# Patient Record
Sex: Female | Born: 1960 | Race: White | Hispanic: No | Smoking: Current every day smoker
Health system: Southern US, Community
[De-identification: ages and names within clinical notes are randomized; demographics above are authoritative.]

## PROBLEM LIST (undated history)

## (undated) DIAGNOSIS — I10 Essential (primary) hypertension: Secondary | ICD-10-CM

## (undated) DIAGNOSIS — G5763 Lesion of plantar nerve, bilateral lower limbs: Secondary | ICD-10-CM

---

## 2000-09-14 ENCOUNTER — Emergency Department (HOSPITAL_COMMUNITY): Admission: EM | Admit: 2000-09-14 | Discharge: 2000-09-14 | Payer: Self-pay | Admitting: Emergency Medicine

## 2000-09-17 ENCOUNTER — Emergency Department (HOSPITAL_COMMUNITY): Admission: EM | Admit: 2000-09-17 | Discharge: 2000-09-17 | Payer: Self-pay | Admitting: Internal Medicine

## 2003-05-25 ENCOUNTER — Emergency Department (HOSPITAL_COMMUNITY): Admission: EM | Admit: 2003-05-25 | Discharge: 2003-05-25 | Payer: Self-pay | Admitting: Emergency Medicine

## 2003-10-07 ENCOUNTER — Emergency Department (HOSPITAL_COMMUNITY): Admission: EM | Admit: 2003-10-07 | Discharge: 2003-10-07 | Payer: Self-pay | Admitting: Emergency Medicine

## 2003-10-20 ENCOUNTER — Emergency Department (HOSPITAL_COMMUNITY): Admission: EM | Admit: 2003-10-20 | Discharge: 2003-10-20 | Payer: Self-pay | Admitting: Emergency Medicine

## 2005-07-03 ENCOUNTER — Emergency Department (HOSPITAL_COMMUNITY): Admission: EM | Admit: 2005-07-03 | Discharge: 2005-07-03 | Payer: Self-pay | Admitting: Emergency Medicine

## 2006-06-08 ENCOUNTER — Emergency Department (HOSPITAL_COMMUNITY): Admission: EM | Admit: 2006-06-08 | Discharge: 2006-06-08 | Payer: Self-pay | Admitting: Family Medicine

## 2006-09-07 ENCOUNTER — Observation Stay (HOSPITAL_COMMUNITY): Admission: EM | Admit: 2006-09-07 | Discharge: 2006-09-08 | Payer: Self-pay | Admitting: Emergency Medicine

## 2006-09-13 ENCOUNTER — Inpatient Hospital Stay (HOSPITAL_COMMUNITY): Admission: EM | Admit: 2006-09-13 | Discharge: 2006-09-14 | Payer: Self-pay | Admitting: *Deleted

## 2007-06-11 ENCOUNTER — Ambulatory Visit: Payer: Self-pay | Admitting: Gastroenterology

## 2007-06-28 ENCOUNTER — Ambulatory Visit (HOSPITAL_COMMUNITY): Admission: RE | Admit: 2007-06-28 | Discharge: 2007-06-28 | Payer: Self-pay | Admitting: Gastroenterology

## 2007-06-28 ENCOUNTER — Encounter: Payer: Self-pay | Admitting: Gastroenterology

## 2007-06-28 ENCOUNTER — Ambulatory Visit: Payer: Self-pay | Admitting: Gastroenterology

## 2007-07-08 ENCOUNTER — Ambulatory Visit: Payer: Self-pay | Admitting: Gastroenterology

## 2007-08-03 ENCOUNTER — Ambulatory Visit: Payer: Self-pay | Admitting: Gastroenterology

## 2007-08-09 ENCOUNTER — Encounter (HOSPITAL_COMMUNITY): Admission: RE | Admit: 2007-08-09 | Discharge: 2007-09-08 | Payer: Self-pay | Admitting: Gastroenterology

## 2008-08-01 ENCOUNTER — Ambulatory Visit (HOSPITAL_COMMUNITY): Admission: RE | Admit: 2008-08-01 | Discharge: 2008-08-01 | Payer: Self-pay | Admitting: Podiatry

## 2010-02-11 ENCOUNTER — Emergency Department (HOSPITAL_COMMUNITY): Admission: EM | Admit: 2010-02-11 | Discharge: 2010-02-11 | Payer: Self-pay | Admitting: Emergency Medicine

## 2010-02-20 ENCOUNTER — Ambulatory Visit (HOSPITAL_COMMUNITY): Admission: RE | Admit: 2010-02-20 | Discharge: 2010-02-21 | Payer: Self-pay | Admitting: Neurological Surgery

## 2010-03-18 ENCOUNTER — Encounter: Admission: RE | Admit: 2010-03-18 | Discharge: 2010-03-18 | Payer: Self-pay | Admitting: Neurological Surgery

## 2010-08-01 LAB — CBC
HCT: 43.1 % (ref 36.0–46.0)
Hemoglobin: 15.3 g/dL — ABNORMAL HIGH (ref 12.0–15.0)
MCH: 32.8 pg (ref 26.0–34.0)
MCHC: 35.5 g/dL (ref 30.0–36.0)
MCV: 92.5 fL (ref 78.0–100.0)
Platelets: 228 10*3/uL (ref 150–400)
RBC: 4.66 MIL/uL (ref 3.87–5.11)
RDW: 13.3 % (ref 11.5–15.5)
WBC: 8.2 10*3/uL (ref 4.0–10.5)

## 2010-08-01 LAB — BASIC METABOLIC PANEL
BUN: 10 mg/dL (ref 6–23)
CO2: 28 mEq/L (ref 19–32)
Calcium: 8.8 mg/dL (ref 8.4–10.5)
Chloride: 104 mEq/L (ref 96–112)
Creatinine, Ser: 0.72 mg/dL (ref 0.4–1.2)
GFR calc Af Amer: >60 mL/min (ref >60)
GFR calc non Af Amer: >60 mL/min (ref >60)
Glucose, Bld: 77 mg/dL (ref 70–99)
Potassium: 4.2 mEq/L (ref 3.5–5.1)
Sodium: 136 mEq/L (ref 135–145)

## 2010-08-01 LAB — DIFFERENTIAL
Basophils Absolute: 0 10*3/uL (ref 0.0–0.1)
Basophils Relative: 0 % (ref 0–1)
Eosinophils Absolute: 0.2 10*3/uL (ref 0.0–0.7)
Eosinophils Relative: 2 % (ref 0–5)
Lymphocytes Relative: 42 % (ref 12–46)
Lymphs Abs: 3.4 10*3/uL (ref 0.7–4.0)
Monocytes Absolute: 0.7 10*3/uL (ref 0.1–1.0)
Monocytes Relative: 8 % (ref 3–12)
Neutro Abs: 3.8 10*3/uL (ref 1.7–7.7)
Neutrophils Relative %: 47 % (ref 43–77)

## 2010-08-01 LAB — APTT: aPTT: 28 seconds (ref 24–37)

## 2010-08-01 LAB — SURGICAL PCR SCREEN
MRSA, PCR: NEGATIVE
Staphylococcus aureus: NEGATIVE

## 2010-08-01 LAB — HCG, SERUM, QUALITATIVE: Preg, Serum: NEGATIVE

## 2010-08-01 LAB — PROTIME-INR
INR: 0.97 (ref 0.00–1.49)
Prothrombin Time: 13.1 seconds (ref 11.6–15.2)

## 2010-10-01 NOTE — Group Therapy Note (Signed)
NAMETELESHA, DEGUZMAN               ACCOUNT NO.:  [id number]   MEDICAL RECORD NO.:  [id number]          PATIENT TYPE:  AMB   LOCATION:  DAY                           FACILITY:  [facility]   PHYSICIAN:  Cody M. Ulice Brilliant, D.P.M.  DATE OF BIRTH:  1960/08/30                                 PROGRESS NOTE   PREOPERATIVE DIAGNOSIS:  Chronic plantar fascitis/plantar fasciosis, left heel.   POSTOPERATIVE DIAGNOSIS:  Chronic plantar fascitis/plantar fasciosis, left heel.   PROCEDURE PERFORMED:  Percutaneous microfasciotomy via Topaz wand, left heel.   SURGEON:  Denny Peon. Ulice Brilliant, D.P.M.   ANESTHESIA:  MAC.   ANESTHESIOLOGIST:  Johnney Killian, M.D.   INDICATIONS FOR SURGERY:  A greater than 1 year history of pain in left heel with classic plantar  fascitis heel spur symptoms.  Patient clinically is noted to have  discomfort to palpation over the medial central and lateral band of the  fascia extending into the proximal aspect of the arch, continuous  throbbing pain, worse upon arising after sitting or upon getting up in  the morning.  Ms. Colvin has began missing work because of this  condition.   DESCRIPTION OF PROCEDURE:  Prior to the procedure in the preoperative holding area, Ms. Viglione is  evaluated.  The area of maximal tenderness is confined and demarcated  utilizing a skin marking pen.  Ms. Jupiter has been brought into the OR.  IV sedation is established.  A posterior tibial nerve block is performed  about her left ankle.  Further local anesthesia is administered about  the medial and lateral aspects of the heel.  A pneumatic ankle  tourniquet is then applied across her left ankle.  Her foot is prepped  and draped in the usual aseptic fashion.  An Ace bandage is utilized to  exsanguinate her foot.  The tourniquet is inflated to 250 mmHg.   PROCEDURE:  Percutaneous microfasciotomy, left heel, via Topaz wand.  Attention is  directed to the plantar aspect of the left heel.  The previously  demarcated area of maximal discomfort is appreciated.  A grid pattern is  then demarcated utilizing the skin marking pen within this area.  Once  the area is mapped out, utilizing 5 mm intervals between the areas of  penetration, utilizing a 0.062 K wire, the initial skin incisions are  made in this 5-mm grid pattern.  These number 55.  Following penetration  of all of these sites with the #62 K wire, the straight wand is inserted  through the fascia, and the ablation mode is triggered, utilizing a 0.5  second burst of radiofrequency energy.  This is done through each of the  55 areas that had been marked and penetrated with the 62 K wire, with  care taken to go through the fat pad into the fascia and then releasing  the energy source.  Care is taken to try to vary the depths of the  penetration from one area to the next via reduced pressure on the wand.  Following this 55 burst of energy, the heel is cleaned, 1/2 inch Steri-  Strips are  applied across the area, 3x3's, 4x4's, and 2-inch Desma Paganini are  utilized to dress, followed by a Coban.  Tourniquet is then deflated  with tourniquet spending less than 20 minutes.   Ms. Danish tolerates the anesthesia and procedure well and will be  transported to day hospital.  While there, a list of written  instructions are orally explained to her.  A CAM walker will be applied  and dispensed.  She will be seen in 7 days in the [place] office for her  first postop appointment.      Denny Peon. Ulice Brilliant, D.P.M.  Electronically Signed     CMD/MEDQ  D:  08/01/2008  T:  08/01/2008  Job:  [id number]

## 2010-10-01 NOTE — H&P (Signed)
NAMEMALAIJAH, HOUCHEN               ACCOUNT NO.:  [id number]   MEDICAL RECORD NO.:  [id number]          PATIENT TYPE:  AMB   LOCATION:  DAY                           FACILITY:  [facility]   PHYSICIAN:  Cody M. Ulice Brilliant, D.P.M.  DATE OF BIRTH:  Mar 07, 1961   DATE OF ADMISSION:  DATE OF DISCHARGE:  LH                              HISTORY & PHYSICAL   HISTORY OF PRESENT ILLNESS:  Ms. Vinje has plantar heel pain present for  greater than 1 year.  The patient has been treated conservatively with  injection therapy, rest, nonsteroidal anti-inflammatories which she  could not tolerate, use of a [employer] shoe, time out of work, and  home physical therapy, all of which has resulted in incomplete relief of  plantar left heel pain.  The patient was seen in October and was  injected and she did improve but came back in January and condition has  just not since then in spite of injections and steroids by mouth.   PAST MEDICAL HISTORY:  Significant for ulcerative colitis.   MEDICATIONS:  Her on going list of medications include Aciphex, Ambien,  Paxil.   SOCIAL HISTORY:  She is a half-pack per day smoker, occasional alcohol  drinker.  She has been under incredible family stress secondary to  caring for both a mother-in-law and son who are in failing health.   OBJECTIVE:  Ms. Rogoff has significant discomfort to palpation of the  left heel in the plantar medial region of the fascia just distal to the  calcaneal tubercle.  The area is noted to be somewhat swollen and  painful.   Radiographs do reveal a calcaneal spur.   ASSESSMENT:  Plantar fasciitis/plantar fasciosis, not improving, left  heel.   PLAN:  We have discussed the options which include shock wave therapy,  Coblation therapy, and endoscopic plantar fascial release.  I think in  Ms. Kriegel's case, a Coblation procedure makes the most sense.  She works  Engineer, structural and I would like to get this to resolve without changing the  biomechanics of  her foot.  We have described Coblation to her and how  this works and the fact that it does give complete relief immediately.  She seems to understand.  She has read the consent form, apparently  understood and signed.      Denny Peon. Ulice Brilliant, D.P.M.  Electronically Signed     CMD/MEDQ  D:  07/31/2008  T:  07/31/2008  Job:  [id number]

## 2010-10-01 NOTE — Op Note (Signed)
NAMECHARA, MARQUARD               ACCOUNT NO.:  [id number]   MEDICAL RECORD NO.:  [id number]          PATIENT TYPE:  AMB   LOCATION:  DAY                           FACILITY:  [facility]   PHYSICIAN:  [name], M.D.      DATE OF BIRTH:  1961-02-27   DATE OF PROCEDURE:  06/28/2007  DATE OF DISCHARGE:                               OPERATIVE REPORT   PROCEDURE:  Esophagogastroduodenoscopy with cold forceps biopsy and  Bravo capsule placement.   INDICATION FOR EXAM:  Ms. Shad is a 50 year old female who has had  reflux since 2007.  She states her symptoms were not improved with  Prilosec twice daily.  She had an upper GI in April 2008 which showed  free gastroesophageal reflux.  She occasionally uses Aleve.  She  periodically vomits.  She states she vomits four to five times a week.   FINDINGS:  1. Occasional erythema and erosion in the antrum.  Biopsies obtained      via cold forceps to evaluate for H. pylori gastritis.  2. Normal esophagus without evidence of Barrett's, mass, erosion or      ulceration or stricture.  The GE junction located 35 cm from the      teeth.  The Bravo capsule was placed 29 cm from the teeth.  3. Normal duodenal bulb and second portion of the duodenum.   RECOMMENDATIONS:  1. Continue Bravo study for 96 hours.  She should take her Prilosec      twice daily.  2. She should continue to follow the [facility] handout      recommendations for reflux.  3. No aspirin or NSAIDs for 30 days.  No anticoagulation for five      days.  4. She may resume her previous diet but avoid gastric irritants.  5. Will consider a gastric emptying study if her Bravo study shows no      evidence of uncontrolled gastroesophageal reflux disease on      adequate therapy.   MEDICATIONS:  1. Demerol 50 mg IV.  2. Versed 5 mg IV.   PROCEDURE TECHNIQUE:  Physical exam was performed.  Informed consent was  obtained from the patient after explaining the benefits, risks and  alternatives to the procedure.  The patient was connected to monitor and  placed in left lateral position.  Continuous oxygen was provided by  nasal cannula and IV medicine administered through an indwelling  cannula.  After administration of sedation, the patient's esophagus was  intubated.  The scope was advanced under direct visualization to the  second portion of the duodenum.  The scope was removed slowly by  carefully examining the color, texture, anatomy and integrity of the  mucosa on the way out.  The Bravo capsule introducer was placed in the  esophagus 29 cm from the teeth.  Suction was applied for 1 minute.  The  diagnostic  gastroscope was then advanced into the distal esophagus and successful  placement of the Bravo capsule was confirmed.  The introducer and the  scope were withdrawn.  The patient was recovered in endoscopy  and  discharged home in satisfactory condition.      [name], M.D.  Electronically Signed     SM/MEDQ  D:  06/28/2007  T:  06/29/2007  Job:  [id number]   cc:   Kirstie Peri, MD  Fax: [phone]

## 2010-10-01 NOTE — Assessment & Plan Note (Signed)
[name], [name]                CHART#:  [id number]   DATE:  08/03/2007                       DOB:  [birth year]   REFERRING PHYSICIAN:  Kirstie Peri, MD   DATE OF VISIT:  August 03, 2007.   PROBLEM LIST:  1. Gastroesophageal reflux disease on Prilosec twice daily with a      Bravo study showing adequate acid suppression in February 2009.  2. Mild gastritis without evidence of Helicobacter pylori infection on      esophagogastroduodenoscopy in February 2009.  3. Anxiety.  4. Depression.  5. Cholecystectomy at age 73.  6. Abdominal pain.   SUBJECTIVE:  [name] is a 50 year old female who presents as a return  patient visit.  She was last seen June 28, 2007, for her endoscopy.  I spoke to her on the 18th and let her know that her Prilosec is  working.  She states that Prilosec continues to control her symptoms.  She is keeping her grandkids off of her stomach.  She is having less  problems with her abdominal pain.  She complains of nausea for a month.  She feels like she is starving to death and food makes her more  nauseated.  She has a hard time brushing her teeth because it makes her  gag.  She denies any headaches or vision changes.  She does have  significant posterior nasal drip, which she treats as allergies.  She  thinks the allergy medicine may have made things worse.  She complains  of a scratchy throat.  Off and on she has ear infections, right  greater than left.  She said she has been vomiting every day for a  month.  She has 4 or 5 watery stools daily.  If she eats restaurant  food, that is a whole lot worse.  She has a little milk daily.   MEDICATIONS:  1. Paxil 20 mg daily.  2. Ambien 10 mg q.h.s.  3. Prilosec twice a day.  4. Multivitamin.  5. Calcium with vitamin D.   OBJECTIVE:  VITAL SIGNS:  Weight 196 pounds (down 4 pounds since January  2009).  Height 5 feet 5 inches.  BMI 32.6 (obese).  Temperature 97.6.  Blood pressure 110/80.  Pulse 72.   GENERAL:  She is in no apparent  distress, alert and oriented x4.  HEENT:  Atraumatic, normocephalic.  Her posterior pharynx has mild erythema without exudate or mass.  LUNGS:  Clear to auscultation bilaterally. CARDIOVASCULAR:  Show regular rhythm.  No murmur.  ABDOMEN:  Bowel sounds are present, soft, nondistended.  Mild tenderness to palpation in her flanks without rebound or guarding.  NEURO:  She has no focal neurologic deficits.   ASSESSMENT:  [name] is a 50 year old female who has nausea and  vomiting daily.  The differential diagnosis includes gastroparesis and  nausea and vomiting secondary to chronic posterior nasal drip.  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  She has  watery stools daily which may be irritable bowel or bile salt induced  diarrhea. Thank you for allowing me to see [name] in consultation.  My recommendations follow.   RECOMMENDATIONS:  1. Will add cholestyramine 4 gram packet daily.  She is warned that if      the cholestyramine causes the diarrhea  to get worse, she should      stop it.  2. She may use Levsin 1 or 2 thirty minutes before breakfast, lunch,      or dinner to prevent diarrhea.  She is cautioned that it can cause      drowsiness, dry eyes, dry mouth and urinary retention.  3. We will order a gastric emptying study to evaluate her daily nausea      and vomiting.  4. She has a follow up appointment to see me in 6 weeks.   ADDENDUM:  GES: abnl-43% remains after 2 hrs. Needs Reglan and  Gastroparesis diet.       [name], M.D.  Electronically Signed     SM/MEDQ  D:  08/03/2007  T:  08/04/2007  Job:  [id number]   cc:   Kirstie Peri, MD

## 2010-10-01 NOTE — Op Note (Signed)
[name], [name]               ACCOUNT NO.:  [id number]   MEDICAL RECORD NO.:  [id number]          PATIENT TYPE:  AMB   LOCATION:  DAY                           FACILITY:  [facility]   PHYSICIAN:  [name], M.D.      DATE OF BIRTH:  April 02, 1961   DATE OF PROCEDURE:  06/28/2007  DATE OF DISCHARGE:  06/28/2007                               OPERATIVE REPORT   PROCEDURE:  Bravo pH monitoring study.   REFERRING PHYSICIAN:  Kirstie Peri, M.D.   INDICATION FOR EXAM:  Ms. Defrank is a 50 year old female who presented in  January 2009 complaining of uncontrolled reflux.  She said she was  eating Rolaids and it was not touching her pain and she was vomiting 4-5  times a week.  This was in spite of Prilosec twice daily.  The Bravo  capsule was placed to evaluate for uncontrolled reflux and the need for  Nissen fundoplication.  The study is being performed on therapy.   FINDINGS:  Day one showed study of 21 hours and 41 minutes.  She had six  episodes reflux.  No episodes lasted greater than five minutes.  She  spent one minute with a pH of less than 4 in her esophagus.  The  DeMeester score on day one was 1.   On day two, the duration of the study was 19 hours and 59 minutes.  She  had 25 episodes of reflux.  One episode lasted greater than five  minutes.  She had 21 minutes with a pH of less than 4 in her esophagus.  Her DeMeester score on day two was 6.3.  She had a symptomatic  correlation of chest pain at 87% and heartburn of 43.7%.   DIAGNOSIS:  Ms. Ellenberger is a 50 year old female on Prilosec twice daily  which is providing adequate acid suppression.  She may still have a non-  acid gastroesophageal reflux disease.   RECOMMENDATIONS:  She should continue Prilosec twice daily and to adhere  to the [facility] gastroesophageal reflux disease lifestyle  modification.      [name], M.D.  Electronically Signed     SM/MEDQ  D:  07/08/2007  T:  07/09/2007  Job:  [id number]   cc:    Kirstie Peri, MD  Fax: [phone]

## 2010-10-01 NOTE — Consult Note (Signed)
NAMESANDAR, [name]               ACCOUNT NO.:  [id number]   MEDICAL RECORD NO.:  [id number]          PATIENT TYPE:  AMB   LOCATION:  DAY                           FACILITY:  [facility]   PHYSICIAN:  [name], M.D.      DATE OF BIRTH:  18-Feb-1961   DATE OF CONSULTATION:  06/11/2007  DATE OF DISCHARGE:                                 CONSULTATION   REFERRING PHYSICIAN:  Kirstie Peri, MD   REASON CONSULTATION:  Reflux and abdominal pain.   HISTORY OF PRESENT ILLNESS:  [name] is a 50 year old female who has  had problem with reflux since 2007.  She was eating Rolaids, and it  wasn't touching it.  She vomits 4-5 times a week.  She denies any  weight loss.  She has had diarrhea for years.  She has diarrhea at least  once a day.  She does have normal formed stool as well.  She denies any  nausea.  When she vomits, she feels it come on, she heaves, it comes up,  and then she feels better.  She complains of feeling very tender in her  abdomen and her grandchildren cannot sit on her lap, because she cannot  stand the pressure.  She was told she had a small hiatal hernia.  She  have an upper GI in April 2008 which showed normal esophageal  peristalsis, and a normal stomach.  She had a small hiatal hernia which  demonstrated free gastroesophageal reflux.  She had evidence of prior  peptic ulcer disease.  She denies any rectal bleeding or black tarry  stools.  She has had no trouble swallowing.  She denies any aspirin, BC,  or Goody Powder use.  She rarely uses Aleve.   PAST MEDICAL HISTORY:  1. Stress/anxiety.  2. Depression.  3. Reflux.   PAST SURGICAL HISTORY:  1. Cholecystectomy at age 50 for gallstones.  2. Appendectomy.  3. C-section  4. Tonsillectomy.   ALLERGIES:  SULFA and CODEINE.   MEDICATIONS:  1. Paxil 20 mg daily.  2. Ambien 10 mg nightly.  3. Prilosec b.i.d.  4. Multivitamin.  5. Calcium with vitamin D.   FAMILY HISTORY:  She has an aunt who had colon cancer at  age less than  65.  Her mother and her grandmother had breast cancer.  She had a sister  who had ovarian cancer.  She no family history of uterine cancer.   SOCIAL HISTORY:  Her stress stems from care of her handicapped son.  She  also has a son who has an arrhythmia.  She is separated.  She takes care  of her mother.  She works as a Merchandiser, retail at [employer].  She smokes less  than one pack a day.  She drinks no alcohol.   REVIEW OF SYSTEMS:  Per the HPI, otherwise all systems are negative.   PHYSICAL EXAM:  VITAL SIGNS:  Weight 200 pounds, height 5 feet 5 inches,  BMI 33.3, temperature 97.7, blood pressure 110/78, pulse 18.  GENERAL:  She is in no apparent distress, alert and oriented x4.  HEENT:  Exam is atraumatic, normocephalic.  Pupils equal, and react to  light.  Mouth no oral lesions.  Posterior pharynx without erythema or  exudate.  NECK:  Full range of motion.  No lymphadenopathy.  LUNGS:  Clear to auscultation bilaterally.  CARDIOVASCULAR:  Regular rhythm, no murmur, normal S1-S2.  ABDOMEN:  Bowel sounds are present, soft, nontender, nondistended.  No  rebound or guarding.  No hepatosplenomegaly.  EXTREMITIES:  Have no cyanosis or edema.  NEURO:  She has no focal or neurologic deficits.   ASSESSMENT:  [name] is a 50 year old female who complains of  refractory reflux.  Her symptoms persist in spite of Prilosec twice a  day.  The differential diagnosis includes partially treated  gastroesophageal reflux disease, non-ulcer dyspepsia, or gastritis.  Thank you for allowing me to see [name] in consultation.  My  recommendations follow.   RECOMMENDATIONS:  1. We will schedule her for an EGD with a 96-hour Bravo study in 2      weeks.  2. She is given the [facility] hand out and asked to follow the      recommendations in regards to the management of her reflux.  3. She is to continue the omeprazole and to take it 30 minutes before      her first-and-last meal.  4. She  has a follow up appointment to see me in 2 months.      [name], M.D.  Electronically Signed     SM/MEDQ  D:  06/11/2007  T:  06/11/2007  Job:  [id number]   cc:   Kirstie Peri, MD  Fax: [phone]

## 2010-10-04 NOTE — Discharge Summary (Signed)
NAMEALIS, [name]               ACCOUNT NO.:  [id number]   MEDICAL RECORD NO.:  [id number]          PATIENT TYPE:  OBV   LOCATION:  5733                         FACILITY:  [facility]   PHYSICIAN:  Mobolaji B. Bakare, M.D.DATE OF BIRTH:  [birth year]   DATE OF ADMISSION:  09/06/2006  DATE OF DISCHARGE:  09/08/2006                               DISCHARGE SUMMARY   PRIMARY CARE PHYSICIAN:  Unassigned.   FINAL DIAGNOSES:  1. Nausea and vomiting, likely viral etiology.  2. Gastroesophageal reflux disease.  3. Leukocytosis, reactive.  4. Tobacco abuse.  5. Small hiatal hernia.   SECONDARY DIAGNOSIS:  History of allergic rhino sinusitis.   PROCEDURE:  Upper GI series, showed small hiatal hernia with free  gastroesophageal reflux.  No fixed esophageal strictures or masses.  Scar tissue with __________ contraction.  Scarring in the duodenal bulb  consistent with prior peptic ulcer disease.  No active ulceration.  Linear atelectasis of __________  restricted in the lung base on the  scout image.   BRIEF HISTORY:  Please refer to the admission H&P.   HOSPITAL COURSE:  1. Nausea and vomiting.  Ms. Harmsen presented with nausea and vomiting,      which happened acutely on the day of admission, associated with      dizziness, but there was no fever or diarrhea.  Patient could not      keep anything down.  Patient was admitted and treated      symptomatically.  Symptoms actually abated with IV Phenergan and IV      fluid.  She was mild to moderately dehydrated.  These had resolved.      Her BUN and creatinine were normal.  Urinalysis was unremarkable      for any sign of infection.  She had leukocytosis, which has      resolved because, felt to be reactive in nature.  2. Gastroesophageal reflux disease.  Patient has history of past      complaint of heartburn , which has been ongoing for 2 months, but      this became more exacerbated during this illness  with epigastric      pain and she had  an upper GI series because she had mentioned food      sticking in her throat and GI series report is as noted above.  She      notes reflux disease.  She will continue with PPI.  3. Tobacco abuse.  Patient was counseled on tobacco cessation.  She      will continue with nicotine patch.   DISCHARGE MEDICATIONS:  1. Prilosec OTC 20 mg 2 tablets daily.  2. Benadryl to use at bedtime like before.  3. Plavix 75 mg daily.  4. Nicotine patch 40 mg daily.   INSTRUCTIONS:  To stop Advair/Aleve.   FOLLOWUP:  Patient to follow up with a physician of her choice.  She was  given Dr. Shon Baton contact phone number to set up appointment for  routine followup.      Mobolaji B. Corky Downs, M.D.  Electronically Signed     MBB/MEDQ  D:  09/08/2006  T:  09/08/2006  Job:  [id number]

## 2010-10-04 NOTE — Op Note (Signed)
[facility]  Patient:    [name], [name]                      MRN: [id number] Proc. Date: 09/17/00 Adm. Date:  [id number] Disc. Date: [id number] Attending:  Gustavo Lah A                           Operative Report  CLINICAL HISTORY:  This patient is a 50 year old woman who had a cyst from the labial area removed by Dr. Zachery Dakins earlier today. She apparently understood her instructions as to take the packing out this afternoon which she did and she immediately began bright red bleeding and soaked through several feminine hygiene pads. I asked her to come in the emergency room to evaluate the area.  She had a fair amount of clot on the incision and on the pubic area. The area was unable to be examined secondary to discomfort so we prepped her with some Betadine and anesthetized the area around the prior cyst removal with 2% xylocaine eventually totaling about 15 cc.  DESCRIPTION OF PROCEDURE:  Once adequate anesthesia had been obtained, the clot was evacuated and irrigated and I found what appeared to be a vessel at the bottom of the cyst cavity that was actively arterially bleeding. This was sutured with two sutures of 3-0 chromic. It appeared to track down along the labia at the deep end of the cavity and I put another suture at the lower edge to be sure there was no further bleeding later on. I packed it with a gauze and waited about five minutes and it remained dry. It was then packed again with some Surgical and again waited several minutes to be sure it stayed dry and then applied sterile packings. She is to leave this Surgicel packing in until Saturday when she may shower. If it comes out fine otherwise leave it in until she is back in the office on Monday. She is already on antibiotics. Give her some Vicodin to take home with her for pain for tonight and tomorrow. DD:  09/17/00 TD:  09/18/00 Job: [id number]

## 2010-10-04 NOTE — H&P (Signed)
[name], [name]               ACCOUNT NO.:  [id number]   MEDICAL RECORD NO.:  [id number]          PATIENT TYPE:  OBV   LOCATION:  5733                         FACILITY:  [facility]   PHYSICIAN:  Mobolaji B. Bakare, M.D.DATE OF BIRTH:  30-Aug-1960   DATE OF ADMISSION:  09/06/2006  DATE OF DISCHARGE:                              HISTORY & PHYSICAL   CHIEF COMPLAINT:  Nausea and vomiting.   HISTORY OF PRESENTING COMPLAINT:  [name] is a 50 year old Caucasian  female who has past medical history allergic rhinosinusitis. She has  been staying with her mother at [facility]  for the past three  days.  She has not gone back home.  Her mother has lung cancer with bone  metastases.  She woke up this morning about 8:30 a.m and started feeling  dizzy.  She subsequently developed epigastric pain associated with  nausea and vomiting.  There was no fever, no diarrhea.  She did not eat  anything all day.  Epigastric pain improved and she rated the pain an  8/10 prior to receiving analgesia in the emergency room.  The pain has  not eased off.  During the course of the day, she has vomited several  times.  She stated up to 20 times but no diarrhea.  She came down to the  emergency room for evaluation.  Currently vomiting has eased off as  well.  After she received Phenergan, Zofran, Protonix, Antivert and  Dilaudid.   REVIEW OF SYSTEMS:  The patient states that she has been having  heartburn for about two months, but she has not used any medications for  it.  Of note is that she has been using Advil  p.r.n.  She denies  hematemesis.  No cough, chest pain, shortness of breath, no orthopnea or  PND.   PAST MEDICAL HISTORY:  1. Allergic rhinosinusitis.  2. Syncope in June 2005 which was thought to be vasovagal.  3. Menorrhagia. She follows up with her OB/GYN.   PAST SURGICAL HISTORY:  1. Hysterectomy.  2. Appendectomy.  3. Cesarean section.  4. Tonsillectomy.   CURRENT  MEDICATIONS:  1. Benadryl at bedtime p.r.n.  2. Ferritin one tablet daily.  3. Tylenol p.r.n.  4. Advil p.r.n.   ALLERGIES:  CODEINE, SULFA.   FAMILY HISTORY:  Mother recently diagnosed with lung cancer with bony  metastases.  Father had pancreatic cancer and lung cancer.   SOCIAL HISTORY:  She is  __________ .  She is married. Has three sons.  Youngest is 22.  She smokes half a pack a day of cigarettes.  Does not  drink alcohol.   PHYSICAL EXAMINATION:  VITAL SIGNS:  Temperature 97.9, blood pressure  128/98, pulse 102, respiratory rate 20, oxygen saturation 98% on room  air.  GENERAL:  On examination the patient is uncomfortable.  Not in  respiratory distress.  HEENT: Normocephalic.  She is currently not in any pain, but currently  uncomfortable.  No pale, anicteric.  Mucous membranes dry.  No oral  thrush.  NECK:  No elevated JVD.  No carotid bruits.  LUNGS:  Clear clinically to auscultation.  CARDIOVASCULAR:  S1 and S2 regular.  ABDOMEN:  Not distended.  Soft.  No tenderness.  Bowel sounds present.  No palpable organomegaly.  EXTREMITIES:  Bilateral pitting pedal edema 1+.  Of note is she had  varicose distended extremity veins.  Dorsalis pedis pulses 2+  bilaterally.  CNS:  No focal neurological deficit.   INITIAL LABORATORY DATA:  White cell count 14.6, hemoglobin 15,  hematocrit 43, platelets 245,000, neutrophils 85%, lymphocytes 8%.  Sodium 134, potassium 3.5, chloride 105, bicarb 25, glucose 99, BUN 20,  creatinine 0.7.  AST 18, ALT 18.  Total protein 6.5, albumin 3.4,  calcium 8.3, lipase 27.  Urinalysis specific gravity 1.029, nitrites  negative, small leukocytes.  Microscopy is unremarkable.  White cells 0-  2.   ASSESSMENT/PLAN:  [name] is a 50 year old Caucasian female who has  been staying with her mom in the hospital for the past three nights. She  developed nausea and vomiting and epigastric pain this morning.  She is  dehydrated.  Lipase is normal.   She has elevated white cell count  without fever.  She does have history of heartburn.   ADMISSION DIAGNOSES:  1. Nausea and vomiting, epigastric pain.  Probably viral gastritis.      She will be admitted for 24-hour observation and treatment with      Phenergan 12.5 mg IV q.4h p.r.n., normal saline at 125 mL  per      hour, Protonix 40 mg IV q.24h.  Will check orthostatic blood      pressure.  2. Leukocytosis secondary to #1.  She has no fever, probably viral      etiology.  Will check CBC in the a.m.  3. History of heartburn.  Will start proton pump inhibitor and      probably start the patient on Prilosec OTC.  Will advise to      discontinue Advil.  4. Tobacco abuse.  Will offer smoking cessation counseling and      nicotine patch 14 mg daily.      Mobolaji B. Corky Downs, M.D.  Electronically Signed     MBB/MEDQ  D:  09/06/2006  T:  09/07/2006  Job:  [id number]

## 2010-10-04 NOTE — Discharge Summary (Signed)
[name], [name]               ACCOUNT NO.:  [id number]   MEDICAL RECORD NO.:  [id number]          PATIENT TYPE:  INP   LOCATION:  4707                         FACILITY:  [facility]   PHYSICIAN:  Wilson Singer, M.D.DATE OF BIRTH:  Feb 11, 1961   DATE OF ADMISSION:  09/13/2006  DATE OF DISCHARGE:  09/14/2006                               DISCHARGE SUMMARY   FINAL DISCHARGE DIAGNOSES:  1. Musculoskeletal chest pain.  2. Anxiety/stress.  3. Gastroesophageal reflux disease.   MEDICATIONS ON DISCHARGE:  1. Prilosec 20 mg b.i.d. as she took at home.  2. Tramadol 50 mg 1-2 tablets t.i.d. p.r.n. for pain.  3. Ativan 0.5 mg b.i.d. p.r.n. for anxiety.   CONDITION ON DISCHARGE:  Stable.   HISTORY:  This 50 year old lady was admitted to the hospital with chest  pain for the last 3-4 days prior to admission.  She does have a history  of reflux disease.  Please see initial history and physical examination  done by myself.   HOSPITAL PROGRESS:  She was admitted to the telemetry unit and she  underwent serial cardiac enzyme testing.  These were all negative.  Also  serial electrocardiograms were normal with normal sinus rhythm and no  acute ST/T wave changes.  It is interestingly to note that she was  clearly tender in the right anterior chest, which reproduced her pain.  She also has had a lot of anxiety and stress with her mother who has got  metastatic lung cancer and also one of her sons who is causing problems  at home.   FURTHER DISPOSITION:  I am sending her home on her tramadol for  analgesia.  She has been told not to take any nonsteroidal  antiinflammatory agents due to her severe reflux disease and also Ativan  only to be taken b.i.d. as needed.  I have urged her strongly to find a  primary care physician who will follow her and who may refer her to a  gastroenterologist for an endoscopy if he feels this is needed.  I do  not feel this is needed at this time as she has no acute  problems  regarding this.      Wilson Singer, M.D.  Electronically Signed     NCG/MEDQ  D:  09/14/2006  T:  09/14/2006  Job:  [id number]

## 2010-10-04 NOTE — H&P (Signed)
NAME:  [name], JOBST               ACCOUNT NO.:  [id number]   MEDICAL RECORD NO.:  [id number]          PATIENT TYPE:  EMS   LOCATION:  MAJO                         FACILITY:  [facility]   PHYSICIAN:  Wilson Singer, M.D.DATE OF BIRTH:  [birth year]   DATE OF ADMISSION:  09/13/2006  DATE OF DISCHARGE:                              HISTORY & PHYSICAL   HISTORY:  This is a 50 year old lady who was recently discharged from  the hospital about 5 days after she presented with appeared to be a  viral gastroenteritis and was found to have gastroesophageal reflux  disease on upper GI series.  Please see History and Physical Examination  dated April 20th and also the Discharge Summary dated April 22nd.  She  says that now she has had chest pain for the last 3 to 4 days and in  fact they started on the day that she had an upper GI series which  according to the old chart is on September 07, 2006.  The pain is not  related to respiration.  There is no cough.  She is a smoker as  mentioned previously.   PAST MEDICAL HISTORY:  Significant for gastroesophageal reflux disease  and allergic rhinosinusitis.   PAST SURGICAL HISTORY:  1. Hysterectomy.  2. Appendectomy.  3. C-Section.  4. Tonsillectomy.  5. Cholecystectomy.   SOCIAL HISTORY:  Her mother has lung cancer with metastatic disease and  is at home, and I think is becoming very stressful for her.  Also, her  son is causing a great deal of stress in her life, and she would not  elaborate at this point.  She is a smoker.  Her job is also high-level  stress.   REVIEW OF SYSTEMS:  Apart from the symptoms mentioned above, there are  no other symptoms referable to all systems review.   PHYSICAL EXAMINATION:  Temperature 98.1, blood pressure 106/74, pulse  62, saturation 97% on room air.  Cardiovascular examination:  Heart  sounds are present and normal.  Lung fields are clear.  There is no  pleural rub.  Chest wall:  There is clear tenderness in  the right  anterior chest wall which reproduces her pain. Abdomen: Soft and  nontender with no hepatosplenomegaly.  Neurological:  She is alert and  oriented with no focal neurological signs.   INVESTIGATIONS:  Lipase 18, troponin less than 0.05.   EKG done in the emergency room today shows normal sinus rhythm and is  within normal limits.   IMPRESSION:  1. Possible musculoskeletal chest pain.  2. Gastroesophageal reflux disease.  3. Stress/anxiety.   PLAN:  1. Admit to telemetry.  2. Rule out myocardial infarction.  3. Anxiolytics.  4. Analgesics.   The patient was unkeen to go home although she possibly could have  because this I think is musculoskeletal pain, but I think we will make  sure there are no cardiac problems here, and hopefully she will improve  with the 23-hour observation.  Further recommendations will depend on  her hospital progress.      Wilson Singer, M.D.  Electronically Signed  NCG/MEDQ  D:  09/13/2006  T:  09/13/2006  Job:  [id number]

## 2014-04-24 ENCOUNTER — Other Ambulatory Visit: Payer: Self-pay | Admitting: Neurological Surgery

## 2014-04-24 DIAGNOSIS — M542 Cervicalgia: Secondary | ICD-10-CM

## 2014-05-01 ENCOUNTER — Ambulatory Visit (INDEPENDENT_AMBULATORY_CARE_PROVIDER_SITE_OTHER): Payer: BC Managed Care – PPO

## 2014-05-01 DIAGNOSIS — M47892 Other spondylosis, cervical region: Secondary | ICD-10-CM

## 2014-05-01 DIAGNOSIS — M4802 Spinal stenosis, cervical region: Secondary | ICD-10-CM

## 2014-05-01 DIAGNOSIS — M542 Cervicalgia: Secondary | ICD-10-CM

## 2018-01-10 ENCOUNTER — Emergency Department (HOSPITAL_COMMUNITY): Payer: BLUE CROSS/BLUE SHIELD

## 2018-01-10 ENCOUNTER — Encounter (HOSPITAL_COMMUNITY): Payer: Self-pay

## 2018-01-10 ENCOUNTER — Emergency Department (HOSPITAL_COMMUNITY)
Admission: EM | Admit: 2018-01-10 | Discharge: 2018-01-10 | Disposition: A | Discharge status: Home / Self Care | Payer: BLUE CROSS/BLUE SHIELD | Attending: Emergency Medicine | Admitting: Emergency Medicine

## 2018-01-10 DIAGNOSIS — Y990 Civilian activity done for income or pay: Secondary | ICD-10-CM | POA: Diagnosis not present

## 2018-01-10 DIAGNOSIS — S3992XA Unspecified injury of lower back, initial encounter: Secondary | ICD-10-CM | POA: Diagnosis present

## 2018-01-10 DIAGNOSIS — Y929 Unspecified place or not applicable: Secondary | ICD-10-CM | POA: Insufficient documentation

## 2018-01-10 DIAGNOSIS — S39012A Strain of muscle, fascia and tendon of lower back, initial encounter: Secondary | ICD-10-CM | POA: Diagnosis not present

## 2018-01-10 DIAGNOSIS — I10 Essential (primary) hypertension: Secondary | ICD-10-CM | POA: Diagnosis not present

## 2018-01-10 DIAGNOSIS — X500XXA Overexertion from strenuous movement or load, initial encounter: Secondary | ICD-10-CM | POA: Insufficient documentation

## 2018-01-10 DIAGNOSIS — Y939 Activity, unspecified: Secondary | ICD-10-CM | POA: Diagnosis not present

## 2018-01-10 HISTORY — DX: Essential (primary) hypertension: I10

## 2018-01-10 LAB — URINALYSIS, ROUTINE W REFLEX MICROSCOPIC
Bilirubin Urine: NEGATIVE
Glucose, UA: NEGATIVE mg/dL
Ketones, ur: NEGATIVE mg/dL
Leukocytes, UA: NEGATIVE
Nitrite: NEGATIVE
Protein, ur: NEGATIVE mg/dL
Specific Gravity, Urine: 1.023 (ref 1.005–1.030)
pH: 5 (ref 5.0–8.0)

## 2018-01-10 MED ORDER — METHOCARBAMOL 750 MG PO TABS: 750.0000 mg | ORAL_TABLET | Freq: Four times a day (QID) | ORAL | 0 refills | Status: DC

## 2018-01-10 MED ORDER — IBUPROFEN 600 MG PO TABS: 600.0000 mg | ORAL_TABLET | Freq: Four times a day (QID) | ORAL | 0 refills | Status: DC | PRN

## 2018-01-10 NOTE — ED Triage Notes (Addendum)
Patient complains of lower back pain after bending over at work today. Pain with ROM. Took ibuprofen prior to arrival also complains of burning to left ovary, denies dysuria

## 2018-01-10 NOTE — ED Notes (Signed)
Pt taken via wheelchair to lobby following d/c. Pt given d/c instructions and expressed no questions at time of discharge.

## 2018-01-10 NOTE — ED Provider Notes (Signed)
[facility] EMERGENCY DEPARTMENT Provider Note   CSN: [id number] Arrival date & time: 01/10/18  1629     History   Chief Complaint No chief complaint on file.   HPI MYLENA SEDBERRY is a 57 y.o. female.  57 year old female presents with acute onset of lower back pain after she bent over at work today.  And characterizes sharp and worse with any movement.  No bowel or bladder dysfunction.  Took ibuprofen without relief.  No urinary symptoms.  Pain better with being still     Past Medical History:  Diagnosis Date  . Hypertension     There are no active problems to display for this patient.   History reviewed. No pertinent surgical history.   OB History   None      Home Medications    Prior to Admission medications   Not on File    Family History No family history on file.  Social History Social History   Tobacco Use  . Smoking status: Not on file  Substance Use Topics  . Alcohol use: Not on file  . Drug use: Not on file     Allergies   Patient has no known allergies.   Review of Systems Review of Systems  All other systems reviewed and are negative.    Physical Exam Updated Vital Signs BP 109/74   Pulse 63   Temp 98.1 F (36.7 C) (Oral)   Resp 20   SpO2 99%   Physical Exam  Constitutional: She is oriented to person, place, and time. She appears well-developed and well-nourished.  Non-toxic appearance. No distress.  HENT:  Head: Normocephalic and atraumatic.  Eyes: Pupils are equal, round, and reactive to light. Conjunctivae, EOM and lids are normal.  Neck: Normal range of motion. Neck supple. No tracheal deviation present. No thyroid mass present.  Cardiovascular: Normal rate, regular rhythm and normal heart sounds. Exam reveals no gallop.  No murmur heard. Pulmonary/Chest: Effort normal and breath sounds normal. No stridor. No respiratory distress. She has no decreased breath sounds. She has no wheezes. She has no  rhonchi. She has no rales.  Abdominal: Soft. Normal appearance and bowel sounds are normal. She exhibits no distension. There is no tenderness. There is no rebound and no CVA tenderness.  Musculoskeletal: Normal range of motion. She exhibits no edema or tenderness.       Back:  Neurological: She is alert and oriented to person, place, and time. She has normal strength. No cranial nerve deficit or sensory deficit. GCS eye subscore is 4. GCS verbal subscore is 5. GCS motor subscore is 6.  Skin: Skin is warm and dry. No abrasion and no rash noted.  Psychiatric: She has a normal mood and affect. Her speech is normal and behavior is normal.  Nursing note and vitals reviewed.    ED Treatments / Results  Labs (all labs ordered are listed, but only abnormal results are displayed) Labs Reviewed  URINALYSIS, ROUTINE W REFLEX MICROSCOPIC - Abnormal; Notable for the following components:      Result Value   Hgb urine dipstick SMALL (*)    Bacteria, UA RARE (*)    All other components within normal limits    EKG None  Radiology No results found.  Procedures Procedures (including critical care time)  Medications Ordered in ED Medications - No data to display   Initial Impression / Assessment and Plan / ED Course  I have reviewed the triage vital signs and the  nursing notes.  Pertinent labs & imaging results that were available during my care of the patient were reviewed by me and considered in my medical decision making (see chart for details).     X-rays of lumbar spine negative.  She has no neurological deficits or warning signs for cauda equina.  Return precautions given  Final Clinical Impressions(s) / ED Diagnoses   Final diagnoses:  None    ED Discharge Orders    None       Lorre NickAllen, Tedrick Port, MD 01/10/18 [id number]

## 2018-11-18 ENCOUNTER — Encounter (HOSPITAL_COMMUNITY): Payer: Self-pay | Admitting: Emergency Medicine

## 2018-11-18 ENCOUNTER — Emergency Department (HOSPITAL_COMMUNITY): Payer: BC Managed Care – PPO

## 2018-11-18 ENCOUNTER — Emergency Department (HOSPITAL_COMMUNITY)
Admission: EM | Admit: 2018-11-18 | Discharge: 2018-11-18 | Disposition: A | Discharge status: Home / Self Care | Payer: BC Managed Care – PPO | Attending: Emergency Medicine | Admitting: Emergency Medicine

## 2018-11-18 ENCOUNTER — Other Ambulatory Visit: Payer: Self-pay

## 2018-11-18 DIAGNOSIS — X500XXA Overexertion from strenuous movement or load, initial encounter: Secondary | ICD-10-CM | POA: Diagnosis not present

## 2018-11-18 DIAGNOSIS — S39012A Strain of muscle, fascia and tendon of lower back, initial encounter: Secondary | ICD-10-CM | POA: Diagnosis not present

## 2018-11-18 DIAGNOSIS — Y929 Unspecified place or not applicable: Secondary | ICD-10-CM | POA: Insufficient documentation

## 2018-11-18 DIAGNOSIS — Z882 Allergy status to sulfonamides status: Secondary | ICD-10-CM | POA: Insufficient documentation

## 2018-11-18 DIAGNOSIS — Y9389 Activity, other specified: Secondary | ICD-10-CM | POA: Diagnosis not present

## 2018-11-18 DIAGNOSIS — Y999 Unspecified external cause status: Secondary | ICD-10-CM | POA: Diagnosis not present

## 2018-11-18 DIAGNOSIS — S3992XA Unspecified injury of lower back, initial encounter: Secondary | ICD-10-CM | POA: Diagnosis present

## 2018-11-18 DIAGNOSIS — F17210 Nicotine dependence, cigarettes, uncomplicated: Secondary | ICD-10-CM | POA: Diagnosis not present

## 2018-11-18 DIAGNOSIS — I10 Essential (primary) hypertension: Secondary | ICD-10-CM | POA: Insufficient documentation

## 2018-11-18 MED ORDER — METHYLPREDNISOLONE SODIUM SUCC 125 MG IJ SOLR
125.0000 mg | Freq: Once | INTRAMUSCULAR | Status: AC
  Administered 2018-11-18: 16:00:00 125 mg via INTRAMUSCULAR
  Filled 2018-11-18: qty 2

## 2018-11-18 MED ORDER — HYDROCODONE-ACETAMINOPHEN 5-325 MG PO TABS: 1.0000 | ORAL_TABLET | ORAL | 0 refills | Status: DC | PRN

## 2018-11-18 MED ORDER — PREDNISONE 10 MG PO TABS: ORAL_TABLET | ORAL | 0 refills | Status: AC

## 2018-11-18 MED ORDER — METHOCARBAMOL 500 MG PO TABS: 500.0000 mg | ORAL_TABLET | Freq: Two times a day (BID) | ORAL | 0 refills | Status: AC

## 2018-11-18 MED ORDER — KETOROLAC TROMETHAMINE 60 MG/2ML IM SOLN
60.0000 mg | Freq: Once | INTRAMUSCULAR | Status: AC
  Administered 2018-11-18: 60 mg via INTRAMUSCULAR
  Filled 2018-11-18: qty 2

## 2018-11-18 NOTE — ED Provider Notes (Signed)
[facility] EMERGENCY DEPARTMENT Provider Note   CSN: [id number] Arrival date & time: 11/18/18  1344     History   Chief Complaint Chief Complaint  Patient presents with  . Back Pain    HPI [name] is a 58 y.o. female.     The history is provided by the patient. No language interpreter was used.  Back Pain Location:  Lumbar spine Quality:  Aching Radiates to:  Does not radiate Pain severity:  Moderate Pain is:  Same all the time Onset quality:  Gradual Timing:  Constant Progression:  Worsening Chronicity:  New Context: lifting heavy objects   Relieved by:  Nothing Worsened by:  Nothing Ineffective treatments:  None tried Pt reports she lifts heavy boxes at work.  Pt reports pain mid back.   Past Medical History:  Diagnosis Date  . Hypertension     There are no active problems to display for this patient.   Past Surgical History:  Procedure Laterality Date  . APPENDECTOMY  1972  . CESAREAN SECTION    . CHOLECYSTECTOMY  1980  . TENDON RELEASE     both feet  . TONSILLECTOMY  1975   age 21     OB History   No obstetric history on file.      Home Medications    Prior to Admission medications   Medication Sig Start Date End Date Taking? Authorizing Provider  HYDROcodone-acetaminophen (NORCO/VICODIN) 5-325 MG tablet Take 1 tablet by mouth every 4 (four) hours as needed. 11/18/18   Fransico Meadow, PA-C  methocarbamol (ROBAXIN) 500 MG tablet Take 1 tablet (500 mg total) by mouth 2 (two) times daily. 11/18/18   Fransico Meadow, PA-C  predniSONE (DELTASONE) 10 MG tablet [id number] taper 11/18/18   Fransico Meadow, PA-C    Family History No family history on file.  Social History Social History   Tobacco Use  . Smoking status: Current Every Day Smoker    Packs/day: 0.50  . Smokeless tobacco: Never Used  Substance Use Topics  . Alcohol use: Not on file    Comment: occ  . Drug use: Never     Allergies   Sulfa antibiotics   Review of Systems Review of Systems  Musculoskeletal: Positive for back pain.  All other systems reviewed and are negative.    Physical Exam Updated Vital Signs BP 112/86 (BP Location: Right Arm)   Pulse 60   Temp 97.6 F (36.4 C) (Oral)   Resp 15   SpO2 99%   Physical Exam Vitals signs and nursing note reviewed.  Constitutional:      Appearance: She is well-developed.  HENT:     Head: Normocephalic.     Nose: Nose normal.     Mouth/Throat:     Mouth: Mucous membranes are moist.  Neck:     Musculoskeletal: Normal range of motion.  Cardiovascular:     Rate and Rhythm: Normal rate.     Pulses: Normal pulses.  Pulmonary:     Effort: Pulmonary effort is normal.  Abdominal:     General: There is no distension.  Musculoskeletal: Normal range of motion.     Comments: tendeer lower lumbar spine,  Pain with range of motion,  nv and ns intact   Skin:    General: Skin is warm.  Neurological:     General: No focal deficit present.     Mental Status: She is alert and oriented to person, place, and time.  ED Treatments / Results  Labs (all labs ordered are listed, but only abnormal results are displayed) Labs Reviewed - No data to display  EKG None  Radiology Dg Lumbar Spine Complete  Result Date: 11/18/2018 CLINICAL DATA:  Acute lower back and left leg pain without known injury. EXAM: LUMBAR SPINE - COMPLETE 4+ VIEW COMPARISON:  Radiographs of January 10, 2018. FINDINGS: No fracture or spondylolisthesis is noted. Severe degenerative disc disease is noted at T12-L1. Mild degenerative disc disease is noted at L5-S1. Mild degenerative disc disease is also noted at L2-3. Degenerative changes are seen involving the posterior facet joints of L4-5 and L5-S1. No acute abnormality is noted. IMPRESSION: Multilevel degenerative disc disease. No acute abnormality seen in the lumbar spine. Electronically Signed   By: Lupita RaiderJames  Green Jr M.D.   On: 11/18/2018 15:29    Procedures  Procedures (including critical care time)  Medications Ordered in ED Medications  ketorolac (TORADOL) injection 60 mg (60 mg Intramuscular Given 11/18/18 1536)  methylPREDNISolone sodium succinate (SOLU-MEDROL) 125 mg/2 mL injection 125 mg (125 mg Intramuscular Given 11/18/18 1536)     Initial Impression / Assessment and Plan / ED Course  I have reviewed the triage vital signs and the nursing notes.  Pertinent labs & imaging results that were available during my care of the patient were reviewed by me and considered in my medical decision making (see chart for details).        MDM  Pt given torodol and solumedrol IM.  Pt reexamined and reports she feels better.   Final Clinical Impressions(s) / ED Diagnoses   Final diagnoses:  Strain of lumbar region, initial encounter    ED Discharge Orders         Ordered    predniSONE (DELTASONE) 10 MG tablet     11/18/18 1616    methocarbamol (ROBAXIN) 500 MG tablet  2 times daily     11/18/18 1616    HYDROcodone-acetaminophen (NORCO/VICODIN) 5-325 MG tablet  Every 4 hours PRN     11/18/18 1616        An After Visit Summary was printed and given to the patient.    Elson AreasSofia, Amiere Cawley K, [place] JerseyPA-C 11/18/18 1740    Arby BarrettePfeiffer, Marcy, MD 11/22/18 1119

## 2018-11-18 NOTE — Discharge Instructions (Signed)
See your Physician for recheck.  °

## 2018-11-18 NOTE — ED Triage Notes (Signed)
Pt reports lower middle back pain that radiates to left leg with some numbness.  Denies injury, hx of back surgery. Does heavy lifting at work.

## 2018-11-18 NOTE — ED Notes (Signed)
Patient transported to X-ray 

## 2018-12-01 ENCOUNTER — Other Ambulatory Visit: Payer: Self-pay | Admitting: Podiatry

## 2018-12-01 ENCOUNTER — Ambulatory Visit: Payer: BC Managed Care – PPO | Admitting: Podiatry

## 2018-12-01 ENCOUNTER — Ambulatory Visit (INDEPENDENT_AMBULATORY_CARE_PROVIDER_SITE_OTHER): Payer: BC Managed Care – PPO

## 2018-12-01 ENCOUNTER — Other Ambulatory Visit: Payer: Self-pay

## 2018-12-01 ENCOUNTER — Encounter: Payer: Self-pay | Admitting: Podiatry

## 2018-12-01 VITALS — BP 118/65 | HR 58 | Temp 97.9°F

## 2018-12-01 DIAGNOSIS — M722 Plantar fascial fibromatosis: Secondary | ICD-10-CM | POA: Diagnosis not present

## 2018-12-01 DIAGNOSIS — G5763 Lesion of plantar nerve, bilateral lower limbs: Secondary | ICD-10-CM

## 2018-12-01 NOTE — Patient Instructions (Signed)
Pre-Operative Instructions  Congratulations, you have decided to take an important step towards improving your quality of life.  You can be assured that the doctors and staff at [facility] will be with you every step of the way.  Here are some important things you should know:  1. Plan to be at the surgery center/hospital at least 1 (one) hour prior to your scheduled time, unless otherwise directed by the surgical center/hospital staff.  You must have a responsible adult accompany you, remain during the surgery and drive you home.  Make sure you have directions to the surgical center/hospital to ensure you arrive on time. 2. If you are having surgery at [facility] or [facility] hospitals, you will need a copy of your medical history and physical form from your family physician within one month prior to the date of surgery. We will give you a form for your primary physician to complete.  3. We make every effort to accommodate the date you request for surgery.  However, there are times where surgery dates or times have to be moved.  We will contact you as soon as possible if a change in schedule is required.   4. No aspirin/ibuprofen for one week before surgery.  If you are on aspirin, any non-steroidal anti-inflammatory medications (Mobic, Aleve, Ibuprofen) should not be taken seven (7) days prior to your surgery.  You make take Tylenol for pain prior to surgery.  5. Medications - If you are taking daily heart and blood pressure medications, seizure, reflux, allergy, asthma, anxiety, pain or diabetes medications, make sure you notify the surgery center/hospital before the day of surgery so they can tell you which medications you should take or avoid the day of surgery. 6. No food or drink after midnight the night before surgery unless directed otherwise by surgical center/hospital staff. 7. No alcoholic beverages 24-hours prior to surgery.  No smoking 24-hours prior or 24-hours after  surgery. 8. Wear loose pants or shorts. They should be loose enough to fit over bandages, boots, and casts. 9. Don't wear slip-on shoes. Sneakers are preferred. 10. Bring your boot with you to the surgery center/hospital.  Also bring crutches or a walker if your physician has prescribed it for you.  If you do not have this equipment, it will be provided for you after surgery. 11. If you have not been contacted by the surgery center/hospital by the day before your surgery, call to confirm the date and time of your surgery. 12. Leave-time from work may vary depending on the type of surgery you have.  Appropriate arrangements should be made prior to surgery with your employer. 13. Prescriptions will be provided immediately following surgery by your doctor.  Fill these as soon as possible after surgery and take the medication as directed. Pain medications will not be refilled on weekends and must be approved by the doctor. 14. Remove nail polish on the operative foot and avoid getting pedicures prior to surgery. 15. Wash the night before surgery.  The night before surgery wash the foot and leg well with water and the antibacterial soap provided. Be sure to pay special attention to beneath the toenails and in between the toes.  Wash for at least three (3) minutes. Rinse thoroughly with water and dry well with a towel.  Perform this wash unless told not to do so by your physician.  Enclosed: 1 Ice pack (please put in freezer the night before surgery)   1 Hibiclens skin cleaner     Pre-op instructions  If you have any questions regarding the instructions, please do not hesitate to call our office.  [place]: [address], [place], [place] [address] -- [phone]  [place]: [address] , [place] [address] -- [phone]  [place]: [address]  [place], [place] [address] -- [phone]  [place]: [address], [address], [place], [place] [address] -- [phone]  Website:  [email or web address] 

## 2018-12-04 NOTE — Progress Notes (Signed)
   HPI: 58 year old female presenting today as a new patient, referred by Dr. Gershon Mussel, with a chief complaint of bilateral foot pain that began about three months ago. She states the left foot is worse than the right and she was told she had neuromas bilaterally. She has been using orthotics which have caused pain in the arch. She reports a torn tendon of the left foot which is causing pain in the posterior heel and arch. She has been using a CAM boot as well. There are no modifying factors noted. Patient is here for further evaluation and treatment.   Past Medical History:  Diagnosis Date  . Hypertension      Physical Exam: General: The patient is alert and oriented x3 in no acute distress.  Dermatology: Skin is warm, dry and supple bilateral lower extremities. Negative for open lesions or macerations.  Vascular: Palpable pedal pulses bilaterally. No edema or erythema noted. Capillary refill within normal limits.  Neurological: Epicritic and protective threshold grossly intact bilaterally.   Musculoskeletal Exam: Sharp pain with palpation of the 3rd interspace and lateral compression of the metatarsal heads consistent with neuroma.  Positive Conley Canal sign with loadbearing of the forefoot. Tenderness to palpation to the plantar aspect of the left heel along the plantar fascia.  Radiographic Exam:  Normal osseous mineralization. Joint spaces preserved. No fracture/dislocation/boney destruction.    Assessment: 1.  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 Plantar fasciitis left x years    Plan of Care:  1. Patient was evaluated. X-Rays reviewed.  2. Patient cannot tolerate custom orthotics from Dr. Lindley Magnus office. Recommended good shoe gear.  3. Today we discussed the conservative versus surgical management of the presenting pathology. The patient opts for surgical management. All possible complications and details of the procedure were explained. All patient questions were answered. No  guarantees were expressed or implied. 4. Authorization for surgery was initiated today. Surgery will consist of excision of Morton's neuroma and EPF left.  5. Return to clinic one week post op.   Patient works at [employer]. Currently on FMLA for the left foot.    [name], DPM [facility]  [name], [place]                                        [place], [place] [address]                Office [phone]  Fax [phone]

## 2018-12-17 ENCOUNTER — Telehealth: Payer: Self-pay | Admitting: *Deleted

## 2018-12-17 NOTE — Telephone Encounter (Signed)
"  I was wondering if I could talk with Dr. Amalia Hailey about rescheduling my surgery for any sooner.  My foot is giving me a fit and I'm not tolerating the steroids very well."  I'm returning your call.  Dr. Amalia Hailey just got an opening for August 13, if you'd like that date.  "I will take it.  My foot has been killing me.  What time?"  Someone from the surgical center will call you a day or two prior to your surgery date and will give you your arrival time.  If you haven't done so already, you need to register with the surgical center online.  The instructions are in the brochure that we gave you.  "I'll take a look at it."  I rescheduled her surgery from 01/20/2019 to 12/30/2018 via the surgical center's online portal.

## 2018-12-22 ENCOUNTER — Encounter: Payer: Self-pay | Admitting: Podiatry

## 2018-12-22 NOTE — Progress Notes (Signed)
Member Information Member Number: [id number]  Policy Effective : 72/62/0355 - 05/18/9998   Name: [name] Date of Birth:January 24, 1961   Member Liability Summary      In-Network    Max Per Benefit Period Year-to-Date Remaining  CoInsurance     Deductible  $2,750.00 0.00  Out-Of-Pocket  $6,850.00 2,545.75   [facility] I n-Network  Copay Coinsurance  Not Applicable  97%

## 2018-12-28 ENCOUNTER — Telehealth: Payer: Self-pay | Admitting: *Deleted

## 2018-12-28 NOTE — Telephone Encounter (Signed)
"  We just spoke to [employer].  She said she has a heel spur on the back of her heel.  She said she wants to have that taken off when she has her surgery on Thursday."  She'll need to come in to see Dr. Amalia Hailey prior to adding any procedures on.  Normally it's not the heel spur that is causing the problem, it's the Plantar Fascia Ligament.  I'll give her a call.   I received a call from the scheduler at the surgical center that stated that you had some questions about your heel spur.  "I just wanted to make sure he was aware of it because that's where the most pain is."  He's aware, that's why he's doing the Endoscopic Plantar Fasciotomy.  What's happening is the Plantar Fascia is rubbing against you heel bone, the friction causes the heel spur to develop.  "Oh okay, I just wanted to make sure he was aware."

## 2018-12-30 ENCOUNTER — Encounter: Payer: Self-pay | Admitting: Podiatry

## 2018-12-30 ENCOUNTER — Other Ambulatory Visit: Payer: Self-pay | Admitting: Podiatry

## 2018-12-30 DIAGNOSIS — M722 Plantar fascial fibromatosis: Secondary | ICD-10-CM

## 2018-12-30 DIAGNOSIS — G5762 Lesion of plantar nerve, left lower limb: Secondary | ICD-10-CM

## 2018-12-30 MED ORDER — HYDROCODONE-ACETAMINOPHEN 5-325 MG PO TABS: 1.0000 | ORAL_TABLET | ORAL | 0 refills | Status: DC | PRN

## 2018-12-30 NOTE — Progress Notes (Signed)
.  postop

## 2019-01-04 ENCOUNTER — Encounter: Payer: Self-pay | Admitting: Podiatry

## 2019-01-05 ENCOUNTER — Ambulatory Visit (INDEPENDENT_AMBULATORY_CARE_PROVIDER_SITE_OTHER): Payer: Self-pay | Admitting: Podiatry

## 2019-01-05 ENCOUNTER — Other Ambulatory Visit: Payer: Self-pay

## 2019-01-05 ENCOUNTER — Encounter: Payer: Self-pay | Admitting: Podiatry

## 2019-01-05 ENCOUNTER — Other Ambulatory Visit: Payer: BC Managed Care – PPO

## 2019-01-05 VITALS — Temp 98.7°F

## 2019-01-05 DIAGNOSIS — M722 Plantar fascial fibromatosis: Secondary | ICD-10-CM

## 2019-01-05 DIAGNOSIS — G5763 Lesion of plantar nerve, bilateral lower limbs: Secondary | ICD-10-CM

## 2019-01-05 DIAGNOSIS — Z9889 Other specified postprocedural states: Secondary | ICD-10-CM

## 2019-01-05 MED ORDER — OXYCODONE-ACETAMINOPHEN 5-325 MG PO TABS: 1.0000 | ORAL_TABLET | Freq: Four times a day (QID) | ORAL | 0 refills | Status: DC | PRN

## 2019-01-07 NOTE — Progress Notes (Signed)
   Subjective:  Patient presents today status post Morton's neurectomy and EPF left. DOS: 12/30/2018. She states she is doing well overall. She reports some difficulty ambulating. Wearing the CAM boot helps make walking easier. She has been taking Percocet for pain. Patient is here for further evaluation and treatment.    Past Medical History:  Diagnosis Date  . Hypertension       Objective/Physical Exam Neurovascular status intact.  Skin incisions appear to be well coapted with sutures and staples intact. No sign of infectious process noted. No dehiscence. No active bleeding noted. Moderate edema noted to the surgical extremity.  Assessment: 1. s/p Morton's neurectomy and EPF left. DOS: 12/30/2018   Plan of Care:  1. Patient was evaluated.  2. Dressing changed.  3. Continue weightbearing in CAM boot.  4. Refill prescription for Percocet 5/325 mg provided to patient.  5. Return to clinic in one week for surgical consult of the right foot.    Edrick Kins, DPM [facility]  Dr. Edrick Kins, [place]                                        [place], [place] [address]                Office [phone]  Fax [phone]

## 2019-01-17 ENCOUNTER — Encounter: Payer: Self-pay | Admitting: Podiatry

## 2019-01-17 ENCOUNTER — Ambulatory Visit (INDEPENDENT_AMBULATORY_CARE_PROVIDER_SITE_OTHER): Payer: BC Managed Care – PPO | Admitting: Podiatry

## 2019-01-17 ENCOUNTER — Other Ambulatory Visit: Payer: Self-pay

## 2019-01-17 DIAGNOSIS — G5763 Lesion of plantar nerve, bilateral lower limbs: Secondary | ICD-10-CM | POA: Diagnosis not present

## 2019-01-17 DIAGNOSIS — L600 Ingrowing nail: Secondary | ICD-10-CM | POA: Diagnosis not present

## 2019-01-17 DIAGNOSIS — Z9889 Other specified postprocedural states: Secondary | ICD-10-CM

## 2019-01-17 DIAGNOSIS — M722 Plantar fascial fibromatosis: Secondary | ICD-10-CM

## 2019-01-17 NOTE — Patient Instructions (Signed)
Pre-Operative Instructions  Congratulations, you have decided to take an important step towards improving your quality of life.  You can be assured that the doctors and staff at [facility] will be with you every step of the way.  Here are some important things you should know:  1. Plan to be at the surgery center/hospital at least 1 (one) hour prior to your scheduled time, unless otherwise directed by the surgical center/hospital staff.  You must have a responsible adult accompany you, remain during the surgery and drive you home.  Make sure you have directions to the surgical center/hospital to ensure you arrive on time. 2. If you are having surgery at [facility] or [facility] hospitals, you will need a copy of your medical history and physical form from your family physician within one month prior to the date of surgery. We will give you a form for your primary physician to complete.  3. We make every effort to accommodate the date you request for surgery.  However, there are times where surgery dates or times have to be moved.  We will contact you as soon as possible if a change in schedule is required.   4. No aspirin/ibuprofen for one week before surgery.  If you are on aspirin, any non-steroidal anti-inflammatory medications (Mobic, Aleve, Ibuprofen) should not be taken seven (7) days prior to your surgery.  You make take Tylenol for pain prior to surgery.  5. Medications - If you are taking daily heart and blood pressure medications, seizure, reflux, allergy, asthma, anxiety, pain or diabetes medications, make sure you notify the surgery center/hospital before the day of surgery so they can tell you which medications you should take or avoid the day of surgery. 6. No food or drink after midnight the night before surgery unless directed otherwise by surgical center/hospital staff. 7. No alcoholic beverages 24-hours prior to surgery.  No smoking 24-hours prior or 24-hours after  surgery. 8. Wear loose pants or shorts. They should be loose enough to fit over bandages, boots, and casts. 9. Don't wear slip-on shoes. Sneakers are preferred. 10. Bring your boot with you to the surgery center/hospital.  Also bring crutches or a walker if your physician has prescribed it for you.  If you do not have this equipment, it will be provided for you after surgery. 11. If you have not been contacted by the surgery center/hospital by the day before your surgery, call to confirm the date and time of your surgery. 12. Leave-time from work may vary depending on the type of surgery you have.  Appropriate arrangements should be made prior to surgery with your employer. 13. Prescriptions will be provided immediately following surgery by your doctor.  Fill these as soon as possible after surgery and take the medication as directed. Pain medications will not be refilled on weekends and must be approved by the doctor. 14. Remove nail polish on the operative foot and avoid getting pedicures prior to surgery. 15. Wash the night before surgery.  The night before surgery wash the foot and leg well with water and the antibacterial soap provided. Be sure to pay special attention to beneath the toenails and in between the toes.  Wash for at least three (3) minutes. Rinse thoroughly with water and dry well with a towel.  Perform this wash unless told not to do so by your physician.  Enclosed: 1 Ice pack (please put in freezer the night before surgery)   1 Hibiclens skin cleaner     Pre-op instructions  If you have any questions regarding the instructions, please do not hesitate to call our office.  [place]: [address], [place], [place] [address] -- [phone]  [place]: [address] [place], [place] [address] -- [phone]  [place]: [address]  [place], [place] [address] -- [phone]  [place]: [address], [address], [place],  [address] -- [phone]  Website:  [email or web address] 

## 2019-01-19 NOTE — Progress Notes (Signed)
   Subjective:  Patient presents today status post Morton's neurectomy and EPF left. DOS: 12/30/2018. She states she is doing well. She reports some mild tenderness but states it has been improving. She has been using the CAM boot and taking Percocet as directed. She denies any worsening factors. Patient is here for further evaluation and treatment.    Past Medical History:  Diagnosis Date  . Hypertension       Objective/Physical Exam Neurovascular status intact.  Skin incisions appear to be well coapted with sutures and staples intact. No sign of infectious process noted. No dehiscence. No active bleeding noted. Moderate edema noted to the surgical extremity. Sharp pain with palpation of the left 3rd interspace and lateral compression of the metatarsal heads consistent with neuroma. Positive Conley Canal sign with loadbearing of the forefoot. Medial and lateral borders of the left hallux appears to be erythematous with evidence of an ingrowing nail. Pain on palpation noted to the border of the nail fold.    Assessment: 1. s/p Morton's neurectomy and EPF left. DOS: 12/30/2018 2. Morton's neuroma 3rd interspace right 3. Paronychia with ingrowing nail medial and lateral border left hallux   Plan of Care:  1. Patient was evaluated.  2. Sutures removed.  3. Transition out of CAM boot.  4. Today we discussed the conservative versus surgical management of the presenting pathology. The patient opts for surgical management. All possible complications and details of the procedure were explained. All patient questions were answered. No guarantees were expressed or implied. 5. Authorization for surgery was initiated today. Surgery will consist of excision of Morton's neuroma right 3rd interspace; partial permanent nail avulsion procedure left great toe medial and lateral borders.  6. Return to clinic one week post op.    Edrick Kins, DPM [facility]  Dr. Edrick Kins, [place]                                        [place], [place] [address]                Office [phone]  Fax [phone]

## 2019-02-03 ENCOUNTER — Telehealth: Payer: Self-pay | Admitting: *Deleted

## 2019-02-03 NOTE — Telephone Encounter (Signed)
"  I'm scheduled for surgery with Dr. Amalia Hailey on October 1.  I was calling to see if you have any cancellations or anything. Maybe we can move it a little closer."

## 2019-02-04 NOTE — Telephone Encounter (Signed)
I am returning your call.  Unfortunately, Dr. Amalia Hailey has not had any cancellations of surgeries.  "Alright, I was just making a check.  Thanks for calling me."

## 2019-02-09 ENCOUNTER — Telehealth: Payer: Self-pay | Admitting: Podiatry

## 2019-02-09 NOTE — Telephone Encounter (Signed)
DOS: 02/17/2019 Surgical Procedures: 28080, 16606 Dx Codes: Y04.59, L60.0   Member Information   Member Number: [id number]  Policy Effective : 33/43/5686  -  05/18/9998    Name: [name] Date of Birth:1960/10/22  Member Liability Summary       In-Network   Max Per Benefit Period Year-to-Date Remaining     CoInsurance       Deductible  $2,750.00             0.00     Out-Of-Pocket  $6,850.00           967.65     Hospital - Ambulatory Surgical In-Network Copay Coinsurance Not Applicable 16%   Out-Network Copay Coinsurance Not Applicable 83%   Out-Network Individual Copay Coinsurance Deductible Not Applicable Not Applicable $7,290.21

## 2019-02-17 ENCOUNTER — Other Ambulatory Visit: Payer: Self-pay | Admitting: Podiatry

## 2019-02-17 ENCOUNTER — Encounter: Payer: Self-pay | Admitting: Podiatry

## 2019-02-17 DIAGNOSIS — G5761 Lesion of plantar nerve, right lower limb: Secondary | ICD-10-CM

## 2019-02-17 DIAGNOSIS — L600 Ingrowing nail: Secondary | ICD-10-CM

## 2019-02-17 MED ORDER — OXYCODONE-ACETAMINOPHEN 5-325 MG PO TABS: 1.0000 | ORAL_TABLET | Freq: Four times a day (QID) | ORAL | 0 refills | Status: DC | PRN

## 2019-02-17 NOTE — Progress Notes (Signed)
Post op

## 2019-02-22 ENCOUNTER — Encounter: Payer: Self-pay | Admitting: Podiatry

## 2019-02-23 ENCOUNTER — Other Ambulatory Visit: Payer: Self-pay

## 2019-02-23 ENCOUNTER — Ambulatory Visit (INDEPENDENT_AMBULATORY_CARE_PROVIDER_SITE_OTHER): Payer: Self-pay | Admitting: Podiatry

## 2019-02-23 DIAGNOSIS — L600 Ingrowing nail: Secondary | ICD-10-CM

## 2019-02-23 DIAGNOSIS — Z9889 Other specified postprocedural states: Secondary | ICD-10-CM

## 2019-02-23 DIAGNOSIS — G5763 Lesion of plantar nerve, bilateral lower limbs: Secondary | ICD-10-CM

## 2019-02-27 NOTE — Progress Notes (Signed)
   Subjective:  Patient presents today status post Morton's neuroma excision 3rd interspace right. DOS: 02/17/2019. She states she is doing well. She reports some soreness but states she is healing appropriately. She has been using the post op shoe as directed. Patient is here for further evaluation and treatment.    Past Medical History:  Diagnosis Date  . Hypertension       Objective/Physical Exam Neurovascular status intact. Skin incisions appear to be well coapted with sutures and staples intact of the right foot. No sign of infection. No dehiscence. No active bleeding noted. Moderate edema noted to the surgical extremity.     Assessment: 1. s/p Morton's neurectomy and EPF left. DOS: 12/30/2018 2. S/p Morton's neuroma excision 3rd interspace right DOS: 02/17/2019   Plan of Care:  1. Patient was evaluated.  2. Dressing changed.  3. Continue using post op shoe.  4. Return to clinic in one week for suture removal.   Edrick Kins, DPM [facility]  Dr. Edrick Kins, [place]                                        [place], [place] [address]                Office [phone]  Fax [phone]

## 2019-03-01 ENCOUNTER — Telehealth: Payer: Self-pay | Admitting: Podiatry

## 2019-03-01 NOTE — Telephone Encounter (Signed)
closed

## 2019-03-04 ENCOUNTER — Other Ambulatory Visit: Payer: Self-pay | Admitting: Internal Medicine

## 2019-03-04 DIAGNOSIS — R519 Headache, unspecified: Secondary | ICD-10-CM

## 2019-03-07 ENCOUNTER — Other Ambulatory Visit: Payer: Self-pay

## 2019-03-07 ENCOUNTER — Encounter: Payer: Self-pay | Admitting: Podiatry

## 2019-03-07 ENCOUNTER — Ambulatory Visit (INDEPENDENT_AMBULATORY_CARE_PROVIDER_SITE_OTHER): Payer: Self-pay | Admitting: Podiatry

## 2019-03-07 DIAGNOSIS — G5763 Lesion of plantar nerve, bilateral lower limbs: Secondary | ICD-10-CM

## 2019-03-07 DIAGNOSIS — Z9889 Other specified postprocedural states: Secondary | ICD-10-CM

## 2019-03-10 NOTE — Progress Notes (Signed)
   Subjective:  Patient presents today status post Morton's neuroma excision 3rd interspace right. DOS: 02/17/2019. She states she is doing well. She denies any pain or modifying factors. She reports wearing regular shoes without issue. Patient is here for further evaluation and treatment.    Past Medical History:  Diagnosis Date  . Hypertension       Objective/Physical Exam Neurovascular status intact. Skin incisions appear to be well coapted with sutures and staples intact of the right foot. No sign of infection. No dehiscence. No active bleeding noted. Moderate edema noted to the surgical extremity.    Assessment: 1. s/p Morton's neurectomy and EPF left. DOS: 12/30/2018 2. S/p Morton's neuroma excision 3rd interspace right DOS: 02/17/2019   Plan of Care:  1. Patient was evaluated.  2. Sutures removed.  3. Resume wearing good shoe gear.  4. Return to work 03/22/2019 with full activity no restrictions.  5. Return to clinic in 4 weeks.    Edrick Kins, DPM [facility]  Dr. Edrick Kins, [place]                                        [place], [place] [address]                Office [phone]  Fax [phone]

## 2019-03-11 ENCOUNTER — Other Ambulatory Visit: Payer: BC Managed Care – PPO

## 2019-04-04 ENCOUNTER — Encounter: Payer: BC Managed Care – PPO | Admitting: Podiatry

## 2019-04-25 ENCOUNTER — Encounter: Payer: BC Managed Care – PPO | Admitting: Podiatry

## 2019-05-31 ENCOUNTER — Emergency Department (HOSPITAL_COMMUNITY)
Admission: EM | Admit: 2019-05-31 | Discharge: 2019-05-31 | Disposition: A | Discharge status: Home / Self Care | Payer: BC Managed Care – PPO | Attending: Emergency Medicine | Admitting: Emergency Medicine

## 2019-05-31 ENCOUNTER — Emergency Department (HOSPITAL_COMMUNITY): Payer: BC Managed Care – PPO

## 2019-05-31 ENCOUNTER — Other Ambulatory Visit: Payer: Self-pay

## 2019-05-31 ENCOUNTER — Encounter (HOSPITAL_COMMUNITY): Payer: Self-pay | Admitting: Emergency Medicine

## 2019-05-31 DIAGNOSIS — M5412 Radiculopathy, cervical region: Secondary | ICD-10-CM | POA: Diagnosis not present

## 2019-05-31 DIAGNOSIS — M546 Pain in thoracic spine: Secondary | ICD-10-CM | POA: Diagnosis not present

## 2019-05-31 DIAGNOSIS — I10 Essential (primary) hypertension: Secondary | ICD-10-CM | POA: Diagnosis not present

## 2019-05-31 DIAGNOSIS — Z79899 Other long term (current) drug therapy: Secondary | ICD-10-CM | POA: Insufficient documentation

## 2019-05-31 DIAGNOSIS — F17210 Nicotine dependence, cigarettes, uncomplicated: Secondary | ICD-10-CM | POA: Insufficient documentation

## 2019-05-31 DIAGNOSIS — R202 Paresthesia of skin: Secondary | ICD-10-CM | POA: Diagnosis present

## 2019-05-31 HISTORY — DX: Lesion of plantar nerve, bilateral lower limbs: G57.63

## 2019-05-31 LAB — CBC WITH DIFFERENTIAL/PLATELET
Abs Immature Granulocytes: 0.02 10*3/uL (ref 0.00–0.07)
Basophils Absolute: 0 10*3/uL (ref 0.0–0.1)
Basophils Relative: 0 %
Eosinophils Absolute: 0.1 10*3/uL (ref 0.0–0.5)
Eosinophils Relative: 2 %
HCT: 44.8 % (ref 36.0–46.0)
Hemoglobin: 14.7 g/dL (ref 12.0–15.0)
Immature Granulocytes: 0 %
Lymphocytes Relative: 43 %
Lymphs Abs: 2.7 10*3/uL (ref 0.7–4.0)
MCH: 30.3 pg (ref 26.0–34.0)
MCHC: 32.8 g/dL (ref 30.0–36.0)
MCV: 92.4 fL (ref 80.0–100.0)
Monocytes Absolute: 0.5 10*3/uL (ref 0.1–1.0)
Monocytes Relative: 7 %
Neutro Abs: 3 10*3/uL (ref 1.7–7.7)
Neutrophils Relative %: 48 %
Platelets: 198 10*3/uL (ref 150–400)
RBC: 4.85 MIL/uL (ref 3.87–5.11)
RDW: 12.9 % (ref 11.5–15.5)
WBC: 6.3 10*3/uL (ref 4.0–10.5)
nRBC: 0 % (ref 0.0–0.2)

## 2019-05-31 LAB — BASIC METABOLIC PANEL
Anion gap: 9 (ref 5–15)
BUN: 16 mg/dL (ref 6–20)
CO2: 24 mmol/L (ref 22–32)
Calcium: 9.2 mg/dL (ref 8.9–10.3)
Chloride: 106 mmol/L (ref 98–111)
Creatinine, Ser: 0.84 mg/dL (ref 0.44–1.00)
GFR calc Af Amer: >60 mL/min (ref >60)
GFR calc non Af Amer: >60 mL/min (ref >60)
Glucose, Bld: 85 mg/dL (ref 70–99)
Potassium: 4.1 mmol/L (ref 3.5–5.1)
Sodium: 139 mmol/L (ref 135–145)

## 2019-05-31 LAB — SEDIMENTATION RATE: Sed Rate: 8 mm/hr (ref 0–22)

## 2019-05-31 MED ORDER — MORPHINE SULFATE (PF) 4 MG/ML IV SOLN
4.0000 mg | Freq: Once | INTRAVENOUS | Status: AC
  Administered 2019-05-31: 4 mg via INTRAVENOUS
  Filled 2019-05-31: qty 1

## 2019-05-31 MED ORDER — ACETAMINOPHEN 500 MG PO TABS: 500.0000 mg | ORAL_TABLET | Freq: Four times a day (QID) | ORAL | 0 refills | Status: AC | PRN

## 2019-05-31 MED ORDER — ONDANSETRON HCL 4 MG/2ML IJ SOLN
4.0000 mg | Freq: Once | INTRAMUSCULAR | Status: AC
  Administered 2019-05-31: 15:00:00 4 mg via INTRAVENOUS
  Filled 2019-05-31: qty 2

## 2019-05-31 MED ORDER — GADOBUTROL 1 MMOL/ML IV SOLN
6.0000 mL | Freq: Once | INTRAVENOUS | Status: AC | PRN
  Administered 2019-05-31: 6 mL via INTRAVENOUS

## 2019-05-31 NOTE — Discharge Instructions (Signed)
Please take your medications, as prescribed.  Please follow-up with [facility] for ongoing evaluation management of your cervical and thoracic spine disorders.  Please return to the ED or seek medical attention for any new or worsening symptoms.

## 2019-05-31 NOTE — ED Triage Notes (Signed)
Pt reports upper middle back pain since Saturday that worsens with movement of neck. Reports hands "burning". Denies injury, no vision changes or h/a. ran a "low grade fever" Saturday and Sunday. Denies sick contacts.

## 2019-05-31 NOTE — ED Provider Notes (Signed)
[facility] Provider Note   CSN: [id number] Arrival date & time: 05/31/19  1146     History No chief complaint on file.   [name] is a 59 y.o. female.  No significant PMH who presents to the ED with a 4-week history of waking up in the middle the night or in the morning with excruciating 10 out of 10 burning sensation of hands bilaterally that is only improved with getting up and moving around.  For the past 4 days, she has been experiencing atraumatic upper thoracic midline back pain and states that she has been experiencing diminished range of motion of her neck, particularly difficult to look up.  She states that for the past couple of days she has also been experiencing temperatures around 99.5 F for which she has been taking ibuprofen.  She reports that she did have an anterior cervical fusion years ago which was well-tolerated.  She denies any recent injury, history of malignancy, history of IVDA, gait disturbance, ataxia, diminished strength or numbness, visual deficits, or other neurologic symptoms outside of her bilateral hand burning sensations.  She works at a [employer] and admits that she has a physically demanding position.  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   Past Surgical History:  Procedure Laterality Date   APPENDECTOMY  1972   CESAREAN SECTION     CHOLECYSTECTOMY  1980   TENDON RELEASE     both feet   TONSILLECTOMY  1975   age 81     OB History   No obstetric history on file.     History reviewed. No pertinent family history.  Social History   Tobacco Use   Smoking status: Current Every Day Smoker    Packs/day: 0.50   Smokeless tobacco: Never Used  Substance Use Topics   Alcohol use: Not on file    Comment: occ   Drug use: Never    Home  Medications Prior to Admission medications   Medication Sig Start Date End Date Taking? Authorizing Provider  acetaminophen (TYLENOL) 500 MG tablet Take 1 tablet (500 mg total) by mouth every 6 (six) hours as needed. 05/31/19   Corena Herter, PA-C  buPROPion (WELLBUTRIN XL) 150 MG 24 hr tablet Take 150 mg by mouth at bedtime. 11/26/18   [provider]  HYDROcodone-acetaminophen (NORCO/VICODIN) 5-325 MG tablet Take 1 tablet by mouth every 4 (four) hours as needed. 12/30/18   Edrick Kins, DPM  meloxicam (MOBIC) 7.5 MG tablet  11/30/18   [provider]  methocarbamol (ROBAXIN) 500 MG tablet Take 1 tablet (500 mg total) by mouth 2 (two) times daily. 11/18/18   Fransico Meadow, PA-C  metoprolol succinate (TOPROL-XL) 25 MG 24 hr tablet Take 25 mg by mouth daily. 11/10/18   [provider]  oxyCODONE-acetaminophen (PERCOCET) 5-325 MG tablet Take 1 tablet by mouth every 6 (six) hours as needed for severe pain. 02/17/19   Edrick Kins, DPM  predniSONE (DELTASONE) 10 MG tablet [id number] taper 11/18/18   Caryl Ada K, PA-C  simvastatin (ZOCOR) 20 MG tablet Take 20 mg by mouth daily. 11/26/18   [provider]    Allergies    Sulfa antibiotics and Codeine  Review of Systems   Review of Systems  All other systems  reviewed and are negative.    Physical Exam Updated Vital Signs BP 130/70 (BP Location: Left Arm)    Pulse (!) 52    Temp 98.1 F (36.7 C) (Oral)    Resp 16    Ht '5\' 4"'  (1.626 m)    Wt 67.1 kg    SpO2 96%    BMI 25.40 kg/m   Physical Exam Vitals and nursing note reviewed. Exam conducted with a chaperone present.  Constitutional:      General: She is not in acute distress.    Appearance: Normal appearance.  HENT:     Head: Normocephalic and atraumatic.  Eyes:     General: No scleral icterus.    Conjunctiva/sclera: Conjunctivae normal.  Neck:     Comments: Mildly limited ROM, but can turn head in all directions.  No tenderness to palpation.  No  overlying skin changes.  No lymphadenopathy.  Supple. Cardiovascular:     Rate and Rhythm: Normal rate and regular rhythm.     Pulses: Normal pulses.     Heart sounds: Normal heart sounds.  Pulmonary:     Effort: Pulmonary effort is normal. No respiratory distress.     Breath sounds: Normal breath sounds.  Abdominal:     General: Abdomen is flat. There is no distension.     Palpations: Abdomen is soft.     Tenderness: There is no abdominal tenderness. There is no guarding.  Musculoskeletal:     Comments: Significant midline tenderness to palpation over upper thoracic region, particularly T2-T3.  No overlying skin changes.  No significant TTP elsewhere.  No guarding.  Skin:    General: Skin is dry.  Neurological:     General: No focal deficit present.     Mental Status: She is alert and oriented to person, place, and time.     GCS: GCS eye subscore is 4. GCS verbal subscore is 5. GCS motor subscore is 6.     Cranial Nerves: No cranial nerve deficit.     Sensory: No sensory deficit.     Motor: No weakness.     Coordination: Coordination normal.     Gait: Gait normal.     Comments: CN II to XII grossly intact.  Negative Romberg and cerebellar exams.  Normal sensation intact throughout.  No gait abnormality.  Psychiatric:        Mood and Affect: Mood normal.        Behavior: Behavior normal.        Thought Content: Thought content normal.      ED Results / Procedures / Treatments   Labs (all labs ordered are listed, but only abnormal results are displayed) Labs Reviewed  CBC WITH DIFFERENTIAL/PLATELET  BASIC METABOLIC PANEL  SEDIMENTATION RATE    EKG None  Radiology MR Cervical Spine W or Wo Contrast  Result Date: 05/31/2019 CLINICAL DATA:  Neck and back pain beginning 3 days ago. Low-grade fever. Assess for spinal infection. EXAM: MRI CERVICAL SPINE WITHOUT AND WITH CONTRAST TECHNIQUE: Multiplanar and multiecho pulse sequences of the cervical spine, to include the  craniocervical junction and cervicothoracic junction, were obtained without and with intravenous contrast. CONTRAST:  62m GADAVIST GADOBUTROL 1 MMOL/ML IV SOLN COMPARISON:  05/01/2014 FINDINGS: Alignment: Straightening of the normal cervical lordosis. Vertebrae: Previous ACDF C5 through C7. Cord: No cord compression or primary cord lesion. Posterior Fossa, vertebral arteries, paraspinal tissues: Negative Disc levels: Ordinary osteoarthritis at the C1-2 articulation but without encroachment upon the neural structures. Mild, non-compressive disc bulges  at C2-3, C3-4 and C4-5. Facet osteoarthritis present at those levels could be painful. There is foraminal narrowing due to osteophytic encroachment at C3-4 and C4-5 which could be symptomatic. Previous ACDF C5 through C7 has a good appearance. Wide patency of the canal and foramina. C7-T1: No disc pathology.  Mild facet osteoarthritis.  No stenosis. IMPRESSION: No sign of infection in the cervical region. Previous ACDF C5 through C7. Degenerative spondylosis and facet arthropathy at C2-3, C3-4 and C4-5 which could be painful. Some foraminal narrowing at those levels. No compressive canal stenosis. Mild facet osteoarthritis at C7-T1. Electronically Signed   By: Nelson Chimes M.D.   On: 05/31/2019 18:54   MR THORACIC SPINE W WO CONTRAST  Result Date: 05/31/2019 CLINICAL DATA:  Back pain. Low-grade fever. Assess for spinal infection. EXAM: MRI THORACIC WITHOUT AND WITH CONTRAST TECHNIQUE: Multiplanar and multiecho pulse sequences of the thoracic spine were obtained without and with intravenous contrast. CONTRAST:  19m GADAVIST GADOBUTROL 1 MMOL/ML IV SOLN COMPARISON:  None. FINDINGS: MRI THORACIC SPINE FINDINGS Alignment:  Normal Vertebrae: Abnormal marrow signal at the T12-L1 level. See below. All other vertebral bodies appear normal. Cord:  No cord compression or primary cord lesion. Paraspinal and other soft tissues: Negative Disc levels: T1-2: Right posterolateral  disc herniation. No cord compression. Mild proximal foraminal narrowing. T2-3: Central disc herniation. Narrowing of the subarachnoid space but no cord compression. No foraminal compromise. T3-4 through T9-10: Unremarkable. No significant disc pathology. Ordinary facet osteoarthritis. T10-11: Minimal disc bulge.  No stenosis. T11-12: Minimal disc bulge.  No stenosis. T12-L1: Probable chronic disc degeneration with loss of disc height. Primarily sclerotic change within the T12 and L1 vertebral bodies. Minimal edema and enhancement, less than would be expected in discitis osteomyelitis. This is quite likely degenerative in nature. IMPRESSION: No sign of spinal infection in the thoracic region. Right paracentral disc herniation at T1-2 could be painful. No gross neural compression however. Central disc herniation at T2-3 could be painful. No visible neural compression. Chronic degenerative spondylosis at T12-L1 could be a source of chronic back pain in that region. No finding to suggest active discitis osteomyelitis per Electronically Signed   By: MNelson ChimesM.D.   On: 05/31/2019 18:59    Procedures Procedures (including critical care time)  Medications Ordered in ED Medications  morphine 4 MG/ML injection 4 mg (4 mg Intravenous Given 05/31/19 1357)  ondansetron (ZOFRAN) injection 4 mg (4 mg Intravenous Given 05/31/19 1454)  morphine 4 MG/ML injection 4 mg (4 mg Intravenous Given 05/31/19 1635)  gadobutrol (GADAVIST) 1 MMOL/ML injection 6 mL (6 mLs Intravenous Contrast Given 05/31/19 1800)    ED Course  I have reviewed the triage vital signs and the nursing notes.  Pertinent labs & imaging results that were available during my care of the patient were reviewed by me and considered in my medical decision making (see chart for details).    MDM Rules/Calculators/A&P                      Given patient's history and exquisite localized tenderness to palpation over upper thoracic spine, will obtain MRI  to rule out epidural abscess, discitis, or vertebral osteomyelitis.  Discussed case with Dr. DStark Jockand he agrees to obtain MRI cervical spine and thoracic spine.  Patient denies any active devices or pacemakers and she does not require any antianxiety medication.  Obtained IV access and will manage patient's pain here in the ED while awaiting MRI.  CBC with  differential, BMP, ESR were all obtained and without any abnormalities. MRI demonstrates no evidence of spinal infection, however right paracentral disc herniation at T1-T2 as well as central disc herniation at T2-T3 that may explain her upper thoracic exquisite tenderness to palpation. Her cervical MRI also demonstrated degenerative spondylolysis and facet arthropathy at C2-C5 with foraminal narrowing that may explain her symptoms of bilateral cervical radiculopathy. Will refer to neurosurgery for ongoing evaluation and management of her radicular symptoms and disc herniations.  Discussed findings with the patient.  Offered short course of Vicodin, to which she declined.  Instead, will prescribe 500 mg Tylenol to take as needed for her discomfort. All of the evaluation and work-up results were discussed with the patient and any family at bedside. They were provided opportunity to ask any additional questions and have none at this time. They have expressed understanding of verbal discharge instructions as well as return precautions and are agreeable to the plan.    Final Clinical Impression(s) / ED Diagnoses Final diagnoses:  Midline thoracic back pain, unspecified chronicity  Cervical radiculopathy    Rx / DC Orders ED Discharge Orders         Ordered    acetaminophen (TYLENOL) 500 MG tablet  Every 6 hours PRN     05/31/19 1936           Corena Herter, PA-C 05/31/19 1936    Veryl Speak, MD 06/02/19 0800

## 2020-05-10 ENCOUNTER — Ambulatory Visit (INDEPENDENT_AMBULATORY_CARE_PROVIDER_SITE_OTHER): Payer: BC Managed Care – PPO | Admitting: Orthopaedic Surgery

## 2020-05-10 ENCOUNTER — Other Ambulatory Visit: Payer: Self-pay

## 2020-05-10 DIAGNOSIS — R200 Anesthesia of skin: Secondary | ICD-10-CM | POA: Diagnosis not present

## 2020-05-10 DIAGNOSIS — M79641 Pain in right hand: Secondary | ICD-10-CM

## 2020-05-10 MED ORDER — GABAPENTIN 100 MG PO CAPS: 100.0000 mg | ORAL_CAPSULE | Freq: Two times a day (BID) | ORAL | 1 refills | Status: AC

## 2020-05-10 NOTE — Progress Notes (Signed)
Office Visit Note   Patient: [name]           Date of Birth: 07-06-1960           MRN: [id number] Visit Date: 05/10/2020              Requested by: Medicine, [facility] [address] [place],  [place] [address] PCP: Medicine, [facility]   Assessment & Plan: Visit Diagnoses:  1. Bilateral hand numbness     Plan: From a neck and upper back standpoint, I did look at the MRIs with her and suggested she see Dr. Yetta Barre again at some point but there was no evidence of any neural compression on either of those studies.  I have recommended at least bilateral upper extremity EMG/NCV studies to evaluate for nerve irritation or nerve compression at the transverse carpal ligament or determining whether this is of more proximal origin.  I will put her on Neurontin 100 mg twice daily for now.  All questions and concerns were answered and addressed.  Once those studies are performed a follow-up appointment can be established with me in the office.  Follow-Up Instructions: No follow-ups on file.   Orders:  No orders of the defined types were placed in this encounter.  Meds ordered this encounter  Medications  . gabapentin (NEURONTIN) 100 MG capsule    Sig: Take 1 capsule (100 mg total) by mouth 2 (two) times daily.    Dispense:  60 capsule    Refill:  1      Procedures: No procedures performed   Clinical Data: No additional findings.   Subjective: Chief Complaint  Patient presents with  . Middle Back - Pain  . Neck - Pain  The patient is I am seeing for the first time as a patient but her son is a patient of mine.  She comes in with a chief complaint of bilateral hand numbness and tingling and she points to the median nerve distribution on both sides.  She is also been dealing with upper mid back pain and neck pain.  She actually has a history of a C5-C6 anterior cervical discectomy and fusion done by Dr. Yetta Barre in 2011.  She has MRIs of her cervical and thoracic spine on the  system from January of this year.  I believe her primary care physician ordered those.  She reports a burning sensation in both hands.  She is not a diabetic.  She reports weak grip and pinch strength as well.  HPI  Review of Systems She currently denies any headache, chest pain, shortness of breath, fever, chills, nausea, vomiting  Objective: Vital Signs: There were no vitals taken for this visit.  Physical Exam She is alert and orient x3 and in no acute distress Ortho Exam Examination of both hands shows that she is a very thin individual but I do not get a good feel that there is muscle atrophy.  She is able to squeeze both hands and fingers pretty easily.  Her Phalen's and Tinel's exams are equivocal. Specialty Comments:  No specialty comments available.  Imaging: No results found.   PMFS History: There are no problems to display for this patient.  Past Medical History:  Diagnosis Date  . Hypertension   . 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  Past Surgical History:  Procedure Laterality Date  . APPENDECTOMY  1972  . CESAREAN SECTION    .  CHOLECYSTECTOMY  1980  . TENDON RELEASE     both feet  . TONSILLECTOMY  62   age 24   Social History   Occupational History  . Not on file  Tobacco Use  . Smoking status: Current Every Day Smoker    Packs/day: 0.50  . Smokeless tobacco: Never Used  Substance and Sexual Activity  . Alcohol use: Not on file    Comment: occ  . Drug use: Never  . Sexual activity: Not on file

## 2020-05-17 ENCOUNTER — Telehealth: Payer: Self-pay | Admitting: Orthopaedic Surgery

## 2020-05-17 NOTE — Telephone Encounter (Signed)
Patient called requesting a call back. Patient is returning a call to [facility]. Please call patient [phone].

## 2020-05-23 ENCOUNTER — Telehealth: Payer: Self-pay | Admitting: Physical Medicine and Rehabilitation

## 2020-05-23 NOTE — Telephone Encounter (Signed)
Patient returned call asked for a call back. The number to contact patient is [phone]

## 2020-05-24 NOTE — Telephone Encounter (Signed)
Pt called back and sch 1/31

## 2020-05-24 NOTE — Telephone Encounter (Signed)
Called pt and LVM #1 

## 2020-05-24 NOTE — Telephone Encounter (Signed)
Return pt call and LVM #1

## 2020-06-18 ENCOUNTER — Ambulatory Visit: Payer: BC Managed Care – PPO | Admitting: Physical Medicine and Rehabilitation

## 2020-06-18 ENCOUNTER — Encounter: Payer: Self-pay | Admitting: Physical Medicine and Rehabilitation

## 2020-06-18 ENCOUNTER — Other Ambulatory Visit: Payer: Self-pay

## 2020-06-18 DIAGNOSIS — R202 Paresthesia of skin: Secondary | ICD-10-CM

## 2020-06-18 NOTE — Progress Notes (Unsigned)
Bilateral hand pain worse at night. Numbness, tingling, and burning. Middle fingers are the worst.  Left hand dominant No lotion per patient Numeric Pain Rating Scale and Functional Assessment Average Pain 10   In the last MONTH (on 0-10 scale) has pain interfered with the following?  1. General activity like being  able to carry out your everyday physical activities such as walking, climbing stairs, carrying groceries, or moving a chair?  Rating(10)

## 2020-06-18 NOTE — Procedures (Signed)
EMG & NCV Findings: Evaluation of the left median motor nerve showed prolonged distal onset latency (4.3 ms).  The right median motor nerve showed prolonged distal onset latency (4.8 ms) and decreased conduction velocity (Elbow-Wrist, 48 m/s).  The left median (across palm) sensory and the right median (across palm) sensory nerves showed prolonged distal peak latency (Wrist, L5.4, R5.3 ms), reduced amplitude (L5.6, R7.7 V), and prolonged distal peak latency (Palm, L2.5, R2.1 ms).  All remaining nerves (as indicated in the following tables) were within normal limits.  Left vs. Right side comparison data for the ulnar motor nerve indicates abnormal L-R velocity difference (A Elbow-B Elbow, 29 m/s).  All remaining left vs. right side differences were within normal limits.    Needle evaluation of the left abductor pollicis brevis muscle showed increased insertional activity.  All remaining muscles (as indicated in the following table) showed no evidence of electrical instability.    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   There is no significant electrodiagnostic evidence of any other focal nerve entrapment, brachial plexopathy or cervical radiculopathy.   Recommendations: 1.  Follow-up with referring physician. 2.  Continue current management of symptoms. 3.  Suggest surgical evaluation.  ___________________________ Naaman Plummer FAAPMR Board Certified, American Board of Physical Medicine and Rehabilitation    Nerve Conduction Studies Anti Sensory Summary Table   Stim Site NR Peak (ms) Norm Peak (ms) P-T Amp (V) Norm P-T Amp Site1 Site2 Delta-P (ms) Dist (cm) Vel (m/s) Norm Vel (m/s)  Left Median Acr Palm Anti Sensory (2nd Digit)  32.6C  Wrist    *5.4 <3.6 *5.6 >10 Wrist Palm 2.9 0.0    Palm    *2.5 <2.0 1.9          Right Median Acr Palm Anti Sensory (2nd Digit)  31.8C  Wrist    *5.3 <3.6 *7.7 >10 Wrist Palm 3.2 0.0    Palm    *2.1 <2.0 10.6         Left Radial Anti Sensory (Base 1st Digit)  33.5C  Wrist    2.2 <3.1 33.6  Wrist Base 1st Digit 2.2 0.0    Right Radial Anti Sensory (Base 1st Digit)  32.3C  Wrist    2.5 <3.1 19.7  Wrist Base 1st Digit 2.5 0.0    Left Ulnar Anti Sensory (5th Digit)  33.6C  Wrist    3.3 <3.7 22.1 >15.0 Wrist 5th Digit 3.3 14.0 42 >38  Right Ulnar Anti Sensory (5th Digit)  32.5C  Wrist    3.2 <3.7 15.2 >15.0 Wrist 5th Digit 3.2 14.0 44 >38   Motor Summary Table   Stim Site NR Onset (ms) Norm Onset (ms) O-P Amp (mV) Norm O-P Amp Site1 Site2 Delta-0 (ms) Dist (cm) Vel (m/s) Norm Vel (m/s)  Left Median Motor (Abd Poll Brev)  33.7C  Wrist    *4.3 <4.2 6.6 >5 Elbow Wrist 3.9 19.5 50 >50  Elbow    8.2  6.4         Right Median Motor (Abd Poll Brev)  32.7C  Wrist    *4.8 <4.2 5.8 >5 Elbow Wrist 4.4 21.0 *48 >50  Elbow    9.2  5.6         Left Ulnar Motor (Abd Dig Min)  33.8C  Wrist    2.8 <4.2 6.9 >3 B Elbow Wrist 3.1 17.5  56 >53  B Elbow    5.9  6.9  A Elbow B Elbow 1.4 10.0 71 >53  A Elbow    7.3  6.6         Right Ulnar Motor (Abd Dig Min)  33C  Wrist    2.7 <4.2 7.1 >3 B Elbow Wrist 3.2 17.5 55 >53  B Elbow    5.9  7.3  A Elbow B Elbow 1.0 10.0 100 >53  A Elbow    6.9  7.4          EMG   Side Muscle Nerve Root Ins Act Fibs Psw Amp Dur Poly Recrt Int Dennie Bible Comment  Left Abd Poll Brev Median C8-T1 *Incr Nml Nml Nml Nml 0 Nml Nml   Left 1stDorInt Ulnar C8-T1 Nml Nml Nml Nml Nml 0 Nml Nml   Left PronatorTeres Median C6-7 Nml Nml Nml Nml Nml 0 Nml Nml   Left Biceps Musculocut C5-6 Nml Nml Nml Nml Nml 0 Nml Nml   Left Deltoid Axillary C5-6 Nml Nml Nml Nml Nml 0 Nml Nml     Nerve Conduction Studies Anti Sensory Left/Right Comparison   Stim Site L Lat (ms) R Lat (ms) L-R Lat (ms) L Amp (V) R Amp (V) L-R Amp (%) Site1 Site2 L Vel (m/s) R Vel (m/s) L-R Vel  (m/s)  Median Acr Palm Anti Sensory (2nd Digit)  32.6C  Wrist *5.4 *5.3 0.1 *5.6 *7.7 27.3 Wrist Palm     Palm *2.5 *2.1 0.4 1.9 10.6 82.1       Radial Anti Sensory (Base 1st Digit)  33.5C  Wrist 2.2 2.5 0.3 33.6 19.7 41.4 Wrist Base 1st Digit     Ulnar Anti Sensory (5th Digit)  33.6C  Wrist 3.3 3.2 0.1 22.1 15.2 31.2 Wrist 5th Digit 42 44 2   Motor Left/Right Comparison   Stim Site L Lat (ms) R Lat (ms) L-R Lat (ms) L Amp (mV) R Amp (mV) L-R Amp (%) Site1 Site2 L Vel (m/s) R Vel (m/s) L-R Vel (m/s)  Median Motor (Abd Poll Brev)  33.7C  Wrist *4.3 *4.8 0.5 6.6 5.8 12.1 Elbow Wrist 50 *48 2  Elbow 8.2 9.2 1.0 6.4 5.6 12.5       Ulnar Motor (Abd Dig Min)  33.8C  Wrist 2.8 2.7 0.1 6.9 7.1 2.8 B Elbow Wrist 56 55 1  B Elbow 5.9 5.9 0.0 6.9 7.3 5.5 A Elbow B Elbow 71 100 *29  A Elbow 7.3 6.9 0.4 6.6 7.4 10.8          Waveforms:

## 2020-06-19 NOTE — Progress Notes (Signed)
[name] - 60 y.o. female MRN [id number]  Date of birth: 12-03-1960  Office Visit Note: Visit Date: 06/18/2020 PCP: [facility], [facility] Referred by: [facility], [facility]  Subjective: Chief Complaint  Patient presents with  . Right Hand - Numbness, Pain  . Left Hand - Numbness, Pain   HPI:  [name] is a 60 y.o. female who comes in today at the request of Dr. Doneen Poisson for electrodiagnostic study of the Bilateral upper extremities.  Patient is Left hand dominant.  She reports several months of severe worsening bilateral hand pain with numbness and tingling predominantly in the radial digits.  She rates her pain as a 10 out of 10.  She denies frank radicular symptoms and no history of diabetes.  No prior electrodiagnostic studies.  ROS Otherwise per HPI.  Assessment & Plan: Visit Diagnoses:    ICD-10-CM   1. 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   There is no significant electrodiagnostic evidence of any other focal nerve entrapment, brachial plexopathy or cervical radiculopathy.   Recommendations: 1.  Follow-up with referring physician. 2.  Continue current management of symptoms. 3.  Suggest surgical evaluation.  Meds & Orders: No orders of the defined types were placed in this encounter.   Orders Placed This Encounter  Procedures  . NCV with EMG (electromyography)    Follow-up: Return in about 2 weeks (around 07/02/2020) for Doneen Poisson, MD.   Procedures: No procedures performed  EMG & NCV Findings: Evaluation of the left median motor nerve showed prolonged distal onset latency (4.3 ms).  The right median motor nerve showed prolonged distal onset latency (4.8 ms) and decreased conduction  velocity (Elbow-Wrist, 48 m/s).  The left median (across palm) sensory and the right median (across palm) sensory nerves showed prolonged distal peak latency (Wrist, L5.4, R5.3 ms), reduced amplitude (L5.6, R7.7 V), and prolonged distal peak latency (Palm, L2.5, R2.1 ms).  All remaining nerves (as indicated in the following tables) were within normal limits.  Left vs. Right side comparison data for the ulnar motor nerve indicates abnormal L-R velocity difference (A Elbow-B Elbow, 29 m/s).  All remaining left vs. right side differences were within normal limits.    Needle evaluation of the left abductor pollicis brevis muscle showed increased insertional activity.  All remaining muscles (as indicated in the following table) showed no evidence of electrical instability.    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   There is no significant electrodiagnostic evidence of any other focal nerve entrapment, brachial plexopathy or cervical radiculopathy.   Recommendations: 1.  Follow-up with referring physician. 2.  Continue current management of symptoms. 3.  Suggest surgical evaluation.  ___________________________ Naaman Plummer FAAPMR Board Certified, American Board of Physical Medicine and Rehabilitation    Nerve Conduction Studies Anti Sensory Summary Table   Stim Site NR Peak (ms) Norm Peak (ms) P-T Amp (V) Norm P-T Amp Site1 Site2 Delta-P (ms) Dist (cm) Vel (m/s) Norm Vel (m/s)  Left Median Acr Palm Anti Sensory (2nd Digit)  32.6C  Wrist    *5.4 <3.6 *5.6 >10 Wrist Palm 2.9 0.0    Palm    *  2.5 <2.0 1.9         Right Median Acr Palm Anti Sensory (2nd Digit)  31.8C  Wrist    *5.3 <3.6 *7.7 >10 Wrist Palm 3.2 0.0    Palm    *2.1 <2.0 10.6         Left Radial Anti Sensory (Base 1st Digit)  33.5C  Wrist    2.2 <3.1  33.6  Wrist Base 1st Digit 2.2 0.0    Right Radial Anti Sensory (Base 1st Digit)  32.3C  Wrist    2.5 <3.1 19.7  Wrist Base 1st Digit 2.5 0.0    Left Ulnar Anti Sensory (5th Digit)  33.6C  Wrist    3.3 <3.7 22.1 >15.0 Wrist 5th Digit 3.3 14.0 42 >38  Right Ulnar Anti Sensory (5th Digit)  32.5C  Wrist    3.2 <3.7 15.2 >15.0 Wrist 5th Digit 3.2 14.0 44 >38   Motor Summary Table   Stim Site NR Onset (ms) Norm Onset (ms) O-P Amp (mV) Norm O-P Amp Site1 Site2 Delta-0 (ms) Dist (cm) Vel (m/s) Norm Vel (m/s)  Left Median Motor (Abd Poll Brev)  33.7C  Wrist    *4.3 <4.2 6.6 >5 Elbow Wrist 3.9 19.5 50 >50  Elbow    8.2  6.4         Right Median Motor (Abd Poll Brev)  32.7C  Wrist    *4.8 <4.2 5.8 >5 Elbow Wrist 4.4 21.0 *48 >50  Elbow    9.2  5.6         Left Ulnar Motor (Abd Dig Min)  33.8C  Wrist    2.8 <4.2 6.9 >3 B Elbow Wrist 3.1 17.5 56 >53  B Elbow    5.9  6.9  A Elbow B Elbow 1.4 10.0 71 >53  A Elbow    7.3  6.6         Right Ulnar Motor (Abd Dig Min)  33C  Wrist    2.7 <4.2 7.1 >3 B Elbow Wrist 3.2 17.5 55 >53  B Elbow    5.9  7.3  A Elbow B Elbow 1.0 10.0 100 >53  A Elbow    6.9  7.4          EMG   Side Muscle Nerve Root Ins Act Fibs Psw Amp Dur Poly Recrt Int Dennie Bible Comment  Left Abd Poll Brev Median C8-T1 *Incr Nml Nml Nml Nml 0 Nml Nml   Left 1stDorInt Ulnar C8-T1 Nml Nml Nml Nml Nml 0 Nml Nml   Left PronatorTeres Median C6-7 Nml Nml Nml Nml Nml 0 Nml Nml   Left Biceps Musculocut C5-6 Nml Nml Nml Nml Nml 0 Nml Nml   Left Deltoid Axillary C5-6 Nml Nml Nml Nml Nml 0 Nml Nml     Nerve Conduction Studies Anti Sensory Left/Right Comparison   Stim Site L Lat (ms) R Lat (ms) L-R Lat (ms) L Amp (V) R Amp (V) L-R Amp (%) Site1 Site2 L Vel (m/s) R Vel (m/s) L-R Vel (m/s)  Median Acr Palm Anti Sensory (2nd Digit)  32.6C  Wrist *5.4 *5.3 0.1 *5.6 *7.7 27.3 Wrist Palm     Palm *2.5 *2.1 0.4 1.9 10.6 82.1       Radial Anti Sensory (Base 1st Digit)  33.5C  Wrist 2.2 2.5 0.3  33.6 19.7 41.4 Wrist Base 1st Digit     Ulnar Anti Sensory (5th Digit)  33.6C  Wrist 3.3 3.2 0.1 22.1 15.2 31.2 Wrist 5th Digit 42  44 2   Motor Left/Right Comparison   Stim Site L Lat (ms) R Lat (ms) L-R Lat (ms) L Amp (mV) R Amp (mV) L-R Amp (%) Site1 Site2 L Vel (m/s) R Vel (m/s) L-R Vel (m/s)  Median Motor (Abd Poll Brev)  33.7C  Wrist *4.3 *4.8 0.5 6.6 5.8 12.1 Elbow Wrist 50 *48 2  Elbow 8.2 9.2 1.0 6.4 5.6 12.5       Ulnar Motor (Abd Dig Min)  33.8C  Wrist 2.8 2.7 0.1 6.9 7.1 2.8 B Elbow Wrist 56 55 1  B Elbow 5.9 5.9 0.0 6.9 7.3 5.5 A Elbow B Elbow 71 100 *29  A Elbow 7.3 6.9 0.4 6.6 7.4 10.8          Waveforms:                      Clinical History: MRI CERVICAL SPINE WITHOUT AND WITH CONTRAST  TECHNIQUE: Multiplanar and multiecho pulse sequences of the cervical spine, to include the craniocervical junction and cervicothoracic junction, were obtained without and with intravenous contrast.  CONTRAST:  48mL GADAVIST GADOBUTROL 1 MMOL/ML IV SOLN  COMPARISON:  05/01/2014  FINDINGS: Alignment: Straightening of the normal cervical lordosis.  Vertebrae: Previous ACDF C5 through C7.  Cord: No cord compression or primary cord lesion.  Posterior Fossa, vertebral arteries, paraspinal tissues: Negative  Disc levels:  Ordinary osteoarthritis at the C1-2 articulation but without encroachment upon the neural structures.  Mild, non-compressive disc bulges at C2-3, C3-4 and C4-5. Facet osteoarthritis present at those levels could be painful. There is foraminal narrowing due to osteophytic encroachment at C3-4 and C4-5 which could be symptomatic.  Previous ACDF C5 through C7 has a good appearance. Wide patency of the canal and foramina.  C7-T1: No disc pathology.  Mild facet osteoarthritis.  No stenosis.  IMPRESSION: No sign of infection in the cervical region.  Previous ACDF C5 through C7.  Degenerative spondylosis and facet arthropathy at  C2-3, C3-4 and C4-5 which could be painful. Some foraminal narrowing at those levels. No compressive canal stenosis.  Mild facet osteoarthritis at C7-T1.   Electronically Signed   By: Paulina Fusi M.D.   On: 05/31/2019 18:54     Objective:  VS:  HT:    WT:   BMI:     BP:   HR: bpm  TEMP: ( )  RESP:  Physical Exam Musculoskeletal:        General: No swelling, tenderness or deformity.     Comments: Inspection reveals no atrophy of the bilateral APB or FDI or hand intrinsics. There is no swelling, color changes, allodynia or dystrophic changes. There is 5 out of 5 strength in the bilateral wrist extension, finger abduction and long finger flexion. There is intact sensation to light touch in all dermatomal and peripheral nerve distributions. There is a posistive Phalen's test bilaterally. There is a negative Hoffmann's test bilaterally.  Skin:    General: Skin is warm and dry.     Findings: No erythema or rash.  Neurological:     General: No focal deficit present.     Mental Status: She is alert and oriented to person, place, and time.     Motor: No weakness or abnormal muscle tone.     Coordination: Coordination normal.  Psychiatric:        Mood and Affect: Mood normal.        Behavior: Behavior normal.      Imaging: No results found.

## 2020-06-25 ENCOUNTER — Encounter: Payer: Self-pay | Admitting: Orthopaedic Surgery

## 2020-06-25 ENCOUNTER — Ambulatory Visit (INDEPENDENT_AMBULATORY_CARE_PROVIDER_SITE_OTHER): Payer: BC Managed Care – PPO | Admitting: Orthopaedic Surgery

## 2020-06-25 DIAGNOSIS — G5601 Carpal tunnel syndrome, right upper limb: Secondary | ICD-10-CM | POA: Insufficient documentation

## 2020-06-25 DIAGNOSIS — G5602 Carpal tunnel syndrome, left upper limb: Secondary | ICD-10-CM | POA: Diagnosis not present

## 2020-06-25 NOTE — Progress Notes (Signed)
The patient is a left-hand-dominant female who comes in for review of nerve conduction studies of her bilateral upper extremities due to severe hand pain with numbness and tingling and weakness.  Both of them hurt equally bad.  The EMG/nerve conduction studies show moderate to severe carpal tunnel syndrome bilaterally.  On exam there is no muscle atrophy of her hands but there is weak grip and pinch strength and significant pain.  I did recommend carpal tunnel release for her.  She would like to proceed with this on the right side.  We had a long and thorough discussion about the anatomy of the transverse carpal ligament.  I talked about surgery in detail in terms of the interoperative and postoperative course as well as the discussion of the risk and benefits of surgery.  Given the significance of her pain she would like to proceed with this in the near future.  All questions and concerns were answered and addressed.  We will work on getting surgery scheduled and then see her back in 2 weeks postoperative.

## 2020-06-29 ENCOUNTER — Telehealth: Payer: Self-pay | Admitting: Orthopaedic Surgery

## 2020-06-29 NOTE — Telephone Encounter (Signed)
LMOM for patient letting her know note ready at front desk

## 2020-06-29 NOTE — Telephone Encounter (Signed)
Patient called requesting a out of work note for 3 days prior to surgery. Patient states she is in severe pain and surgery is set for Thursday 07/05/20. Patient would like to pick note up to turn into employer. Please call patient about this matter at [phone].

## 2020-07-04 ENCOUNTER — Telehealth: Payer: Self-pay | Admitting: Orthopaedic Surgery

## 2020-07-04 NOTE — Telephone Encounter (Signed)
[employer] forms received. Sent to [employer]. 

## 2020-07-05 ENCOUNTER — Other Ambulatory Visit: Payer: Self-pay | Admitting: Orthopaedic Surgery

## 2020-07-05 DIAGNOSIS — G5601 Carpal tunnel syndrome, right upper limb: Secondary | ICD-10-CM

## 2020-07-05 MED ORDER — HYDROCODONE-ACETAMINOPHEN 5-325 MG PO TABS: 1.0000 | ORAL_TABLET | Freq: Four times a day (QID) | ORAL | 0 refills | Status: DC | PRN

## 2020-07-07 ENCOUNTER — Other Ambulatory Visit: Payer: Self-pay

## 2020-07-07 ENCOUNTER — Encounter (HOSPITAL_COMMUNITY): Payer: Self-pay

## 2020-07-07 ENCOUNTER — Emergency Department (HOSPITAL_COMMUNITY)
Admission: EM | Admit: 2020-07-07 | Discharge: 2020-07-07 | Disposition: A | Discharge status: Home / Self Care | Payer: BC Managed Care – PPO | Attending: Emergency Medicine | Admitting: Emergency Medicine

## 2020-07-07 ENCOUNTER — Emergency Department (HOSPITAL_COMMUNITY): Payer: BC Managed Care – PPO

## 2020-07-07 DIAGNOSIS — S6992XA Unspecified injury of left wrist, hand and finger(s), initial encounter: Secondary | ICD-10-CM | POA: Diagnosis present

## 2020-07-07 DIAGNOSIS — S61412A Laceration without foreign body of left hand, initial encounter: Secondary | ICD-10-CM | POA: Diagnosis not present

## 2020-07-07 DIAGNOSIS — Y92480 Sidewalk as the place of occurrence of the external cause: Secondary | ICD-10-CM | POA: Diagnosis not present

## 2020-07-07 DIAGNOSIS — W01198A Fall on same level from slipping, tripping and stumbling with subsequent striking against other object, initial encounter: Secondary | ICD-10-CM | POA: Diagnosis not present

## 2020-07-07 DIAGNOSIS — S60221A Contusion of right hand, initial encounter: Secondary | ICD-10-CM | POA: Diagnosis not present

## 2020-07-07 DIAGNOSIS — Y9301 Activity, walking, marching and hiking: Secondary | ICD-10-CM | POA: Insufficient documentation

## 2020-07-07 DIAGNOSIS — Z23 Encounter for immunization: Secondary | ICD-10-CM | POA: Insufficient documentation

## 2020-07-07 DIAGNOSIS — I10 Essential (primary) hypertension: Secondary | ICD-10-CM | POA: Diagnosis not present

## 2020-07-07 DIAGNOSIS — F17210 Nicotine dependence, cigarettes, uncomplicated: Secondary | ICD-10-CM | POA: Insufficient documentation

## 2020-07-07 DIAGNOSIS — Z79899 Other long term (current) drug therapy: Secondary | ICD-10-CM | POA: Insufficient documentation

## 2020-07-07 MED ORDER — LIDOCAINE-EPINEPHRINE 1 %-1:100000 IJ SOLN
10.0000 mL | Freq: Once | INTRAMUSCULAR | Status: AC
  Administered 2020-07-07: 10 mL
  Filled 2020-07-07: qty 1

## 2020-07-07 MED ORDER — OXYCODONE-ACETAMINOPHEN 5-325 MG PO TABS
1.0000 | ORAL_TABLET | Freq: Once | ORAL | Status: AC
  Administered 2020-07-07: 1 via ORAL
  Filled 2020-07-07: qty 1

## 2020-07-07 MED ORDER — TETANUS-DIPHTH-ACELL PERTUSSIS 5-2.5-18.5 LF-MCG/0.5 IM SUSY
0.5000 mL | PREFILLED_SYRINGE | Freq: Once | INTRAMUSCULAR | Status: AC
  Administered 2020-07-07: 0.5 mL via INTRAMUSCULAR
  Filled 2020-07-07: qty 0.5

## 2020-07-07 NOTE — Discharge Instructions (Signed)
Wear the splint mostly all the time until you see Dr. Magnus Ivan.  You can remove it to clean your hands.  You can take the bandage off tomorrow.  You can wash your hands but do not scrub or soak the stitches.  The sutures need to be removed in approximately 7 days.

## 2020-07-07 NOTE — ED Notes (Signed)
Patient transported to X-ray 

## 2020-07-07 NOTE — ED Provider Notes (Signed)
[name] Arrival date & time: 07/07/20  1149     History No chief complaint on file.   [name] is a 60 y.o. female.  Patient is a 60 year old female with a history of hypertension, recent carpal tunnel surgery last Thursday who is presenting today after a fall.  Patient reports she was walking outside to get some fresh air when her foot got caught on the corner of the sidewalk and she fell forward landing with her arms out in front of her onto the concrete.  Both hands at the concrete.  She has a laceration on her left hand by her left fifth finger and is also having significant pain in her right hand where she recently had surgery.  She denies hitting her head or loss of consciousness.  She reports she has been dealing with vertigo but did not specifically feel dizzy with the event today.  She is unsure when her last tetanus shot was.  She denies any numbness or tingling of the fingers.  The history is provided by the patient.       Past Medical History:  Diagnosis Date  . Hypertension   . 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 Carpal tunnel syndrome, left upper limb 06/25/2020  . Carpal tunnel syndrome, right upper limb 06/25/2020    Past Surgical History:  Procedure Laterality Date  . APPENDECTOMY  1972  . CESAREAN SECTION    . CHOLECYSTECTOMY  1980  . TENDON RELEASE     both feet  . TONSILLECTOMY  1975   age 85     OB History   No obstetric history on file.     No family history on file.  Social History   Tobacco Use  . Smoking status: Current Every Day Smoker    Packs/day: 0.50  . Smokeless tobacco: Never Used  Substance Use Topics  . Drug use: Never    Home Medications Prior to Admission medications   Medication Sig Start Date End Date Taking? Authorizing Provider  acetaminophen (TYLENOL) 500 MG tablet  Take 1 tablet (500 mg total) by mouth every 6 (six) hours as needed. 05/31/19   Lorelee New, PA-C  buPROPion (WELLBUTRIN XL) 150 MG 24 hr tablet Take 150 mg by mouth at bedtime. Patient not taking: Reported on 06/18/2020 11/26/18   [provider]  gabapentin (NEURONTIN) 100 MG capsule Take 1 capsule (100 mg total) by mouth 2 (two) times daily. 05/10/20   Kathryne Hitch, MD  HYDROcodone-acetaminophen (NORCO/VICODIN) 5-325 MG tablet Take 1-2 tablets by mouth every 6 (six) hours as needed. 07/05/20   Kathryne Hitch, MD  meloxicam [facility]) 7.5 MG tablet  11/30/18   [provider]  methocarbamol (ROBAXIN) 500 MG tablet Take 1 tablet (500 mg total) by mouth 2 (two) times daily. 11/18/18   Elson Areas, PA-C  metoprolol succinate (TOPROL-XL) 25 MG 24 hr tablet Take 25 mg by mouth daily. 11/10/18   [provider]  predniSONE (DELTASONE) 10 MG tablet 6.5.4.3.21 taper 11/18/18   Elson Areas, PA-C  simvastatin (ZOCOR) 20 MG tablet Take 20 mg by mouth daily. 11/26/18   [provider]    Allergies    Sulfa antibiotics and Codeine  Review of Systems   Review of Systems  All other systems reviewed and are negative.  Physical Exam Updated Vital Signs BP 138/82 (BP Location: Left Arm)   Pulse (!) 51   Temp 97.7 F (36.5 C)   Resp 18   SpO2 98%   Physical Exam Vitals and nursing note reviewed.  Constitutional:      General: She is not in acute distress.    Appearance: Normal appearance.  HENT:     Head: Normocephalic.  Eyes:     Pupils: Pupils are equal, round, and reactive to light.  Cardiovascular:     Rate and Rhythm: Normal rate.  Pulmonary:     Effort: Pulmonary effort is normal.  Musculoskeletal:        General: Tenderness present.     Right wrist: Swelling present.     Left wrist: Normal.       Hands:     Cervical back: Normal range of motion and neck supple.     Comments: Swelling noted to the right hand which patient  reports is improving  Skin:    General: Skin is warm and dry.     Capillary Refill: Capillary refill takes less than 2 seconds.  Neurological:     General: No focal deficit present.     Mental Status: She is alert and oriented to person, place, and time. Mental status is at baseline.  Psychiatric:        Mood and Affect: Mood normal.        Behavior: Behavior normal.        Thought Content: Thought content normal.     ED Results / Procedures / Treatments   Labs (all labs ordered are listed, but only abnormal results are displayed) Labs Reviewed - No data to display  EKG None  Radiology DG Wrist Complete Right  Result Date: 07/07/2020 CLINICAL DATA:  Fall.  Right wrist pain. EXAM: RIGHT WRIST - COMPLETE 3+ VIEW COMPARISON:  None. FINDINGS: There is no evidence of fracture or dislocation. Degenerative changes are noted in the radial carpus. Soft tissues are unremarkable. IMPRESSION: Negative. Electronically Signed   By: Kennith Center M.D.   On: 07/07/2020 17:54   DG Hand Complete Left  Result Date: 07/07/2020 CLINICAL DATA:  Fall.  Hand laceration. EXAM: LEFT HAND - COMPLETE 3+ VIEW COMPARISON:  None. FINDINGS: Lateral film is limited by superimposition of the fingers. Within this limitation, no fracture or dislocation is evident. Degenerative changes are noted in scattered IP joints. No radiopaque soft tissue foreign body. IMPRESSION: 1. Negative. Electronically Signed   By: Kennith Center M.D.   On: 07/07/2020 17:56    Procedures Procedures   LACERATION REPAIR Performed by: [facility] Authorized by: Gwyneth Sprout Consent: Verbal consent obtained. Risks and benefits: risks, benefits and alternatives were discussed Consent given by: patient Patient identity confirmed: provided demographic data Prepped and Draped in normal sterile fashion Wound explored  Laceration Location: left hand  Laceration Length: 2cm  No Foreign Bodies seen or palpated  Anesthesia:  local infiltration  Local anesthetic: lidocaine 2% with epinephrine  Anesthetic total: 4 ml  Irrigation method: syringe Amount of cleaning: standard  Skin closure: 4.0 prolene  Number of sutures: 6  Technique: simple interrupted  Patient tolerance: Patient tolerated the procedure well with no immediate complications.   Medications Ordered in ED Medications  oxyCODONE-acetaminophen (PERCOCET/ROXICET) 5-325 MG per tablet 1 tablet (has no administration in time range)  Tdap (BOOSTRIX) injection 0.5 mL (has no administration in time range)    ED Course  I have reviewed the triage vital signs  and the nursing notes.  Pertinent labs & imaging results that were available during my care of the patient were reviewed by me and considered in my medical decision making (see chart for details).    MDM Rules/Calculators/A&P                          Patient presenting today after a fall on outstretched hands.  She does have a laceration on the left hand repaired as above.  Tetanus shot was updated.  Patient just had carpal tunnel surgery last week and is having pain over the surgical site.  Plain films to rule out fracture.  Suspect contusion from the fall but patient still able to move all fingers without difficulty.  On the left hand there is mild restriction in full extension of the fifth digit but otherwise full range of motion.  Patient is following up with Dr. Magnus Ivan next week and will have him remove sutures and reevaluate extension of the fifth digit.  Will place patient in a finger splint until that time.  Final Clinical Impression(s) / ED Diagnoses Final diagnoses:  Laceration of left hand without foreign body, initial encounter  Contusion of right hand, initial encounter    Rx / DC Orders ED Discharge Orders    None       Gwyneth Sprout, MD 07/07/20 [phone]

## 2020-07-07 NOTE — ED Notes (Signed)
Patient Alert and oriented to baseline. Stable and ambulatory to baseline. Patient verbalized understanding of the discharge instructions.  Patient belongings were taken by the patient.  Pt refused to stay for discharge vitals.

## 2020-07-07 NOTE — ED Triage Notes (Signed)
Patient complains of left hand lac after tripping on concrete this am. Small laceration to left hand, saline dressing to same. Also hit right hand following carpal tunnel surgery. NAD

## 2020-07-10 ENCOUNTER — Telehealth: Payer: Self-pay | Admitting: Orthopaedic Surgery

## 2020-07-10 NOTE — Telephone Encounter (Signed)
Patient submitted medical release form, short term/FMLA disability forms, $25.00 check payment to [employer]. Accepted 07/10/2020

## 2020-07-12 ENCOUNTER — Encounter: Payer: Self-pay | Admitting: Physician Assistant

## 2020-07-12 ENCOUNTER — Ambulatory Visit (INDEPENDENT_AMBULATORY_CARE_PROVIDER_SITE_OTHER): Payer: BC Managed Care – PPO | Admitting: Physician Assistant

## 2020-07-12 DIAGNOSIS — S61411D Laceration without foreign body of right hand, subsequent encounter: Secondary | ICD-10-CM

## 2020-07-12 DIAGNOSIS — G5602 Carpal tunnel syndrome, left upper limb: Secondary | ICD-10-CM

## 2020-07-12 NOTE — Progress Notes (Signed)
Office Visit Note   Patient: [name]           Date of Birth: 06/22/1960           MRN: [id number] Visit Date: 07/12/2020              Requested by: Medicine, [facility] [address] [place],  [place] [address] PCP: Medicine, [facility]   Assessment & Plan: Visit Diagnoses:  1. Carpal tunnel syndrome, left upper limb   2. Laceration of right hand without foreign body, subsequent encounter     Plan: Right carpal tunnel we will see her back on March 7 and remove the sutures.  Regards to the left hand base of the fifth finger we will remove the sutures also on March 7.  She is to keep the incisions clean and dry.  She can wash her hands for hygiene purposes.  Follow-Up Instructions: Return in about 11 days (around 07/23/2020).   Orders:  No orders of the defined types were placed in this encounter.  No orders of the defined types were placed in this encounter.     Procedures: No procedures performed   Clinical Data: No additional findings.   Subjective: Chief Complaint  Patient presents with  . Right Wrist - Routine Post Op  . Left Little Finger - Pain    HPI [name] returns today 1 week status post right carpal tunnel release.  She states that her hand is doing great.  Has any numbness tingling she does have some pain from the incision but overall doing well.  She unfortunately had a fall on 07/07/2020 and was seen in the ER radiographs were obtained the right wrist and left hand.  I personally reviewed the study showed no acute fractures no bony abnormalities.She denies any loss of consciousness.She is also complaining of left great toe pain.  She is able to don her shoes.  Review of Systems Please see HPI otherwise negative or noncontributory.  Objective: Vital Signs: There were no vitals taken for this visit.  Physical Exam Constitutional:      Appearance: She is not ill-appearing or diaphoretic.  Neurological:     Mental Status: She is alert and  oriented to person, place, and time.  Psychiatric:        Mood and Affect: Mood normal.     Ortho Exam Right hand carpal tunnel surgical incision is healing well no signs of infection.  Sutures well approximate the wound.  Full range of motion the fingers.  Left hand laceration at the base of the fifth finger volar aspect.  Sutures well approximate the wound.  There is no signs of infection.  She is able to flex the fingers lacks last few degrees of flexion of the fifth finger there is no malrotation.  She has full extension of the fifth finger.  Radial pulse 2+.  No other lacerations skin lesions rashes involving the left hand.  Left great toe no bruising over the proximal phalanx.  She is able to flex toes extend her toes.  No tenderness over the first metatarsal head area.  There is no gross deformity of the left foot.  Specialty Comments:  No specialty comments available.  Imaging: No results found.   PMFS History: Patient Active Problem List   Diagnosis Date Noted  . Carpal tunnel syndrome, left upper limb 06/25/2020  . Carpal tunnel syndrome, right upper limb 06/25/2020   Past Medical History:  Diagnosis Date  . Hypertension   .  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 No pertinent family history.  Past Surgical History:  Procedure Laterality Date  . APPENDECTOMY  1972  . CESAREAN SECTION    . CHOLECYSTECTOMY  1980  . TENDON RELEASE     both feet  . TONSILLECTOMY  42   age 60   Social History   Occupational History  . Not on file  Tobacco Use  . Smoking status: Current Every Day Smoker    Packs/day: 0.50  . Smokeless tobacco: Never Used  Substance and Sexual Activity  . Alcohol use: Not on file    Comment: occ  . Drug use: Never  . Sexual activity: Not on file

## 2020-07-24 ENCOUNTER — Encounter: Payer: Self-pay | Admitting: Orthopaedic Surgery

## 2020-07-24 ENCOUNTER — Ambulatory Visit (INDEPENDENT_AMBULATORY_CARE_PROVIDER_SITE_OTHER): Payer: BC Managed Care – PPO | Admitting: Orthopaedic Surgery

## 2020-07-24 DIAGNOSIS — G5602 Carpal tunnel syndrome, left upper limb: Secondary | ICD-10-CM

## 2020-07-24 DIAGNOSIS — G5601 Carpal tunnel syndrome, right upper limb: Secondary | ICD-10-CM

## 2020-07-24 NOTE — Progress Notes (Signed)
[name] returns today follow-up of her right carpal tunnel release surgery.  She is now 2 weeks status post release is overall doing well.  She states she is no longer having pain in the right hand or numbness tingling.  She also suffered a laceration to her left base of fifth finger laceration and is here for suture removal. Patient has known left carpal tunnel syndrome which is moderate to severe by EMG nerve conduction studies.  She is questing to proceed with surgery in the near future for this.  States she is having pain is waking her up in the left hand.   Physical exam: Right hand and left fifth finger base laceration are healing well no signs of infection.  She has full range of motion of the bilateral hands and full sensation bilateral hands light touch.  Impression: Status post right carpal tunnel release 2 weeks Left carpal tunnel syndrome Left fifth finger laceration  Plan: Sutures removed from the surgical incision site and the laceration site Steri-Strips applied.  She is able to get these wet in the shower no submerging hands in water.  We will proceed in the near future with left carpal tunnel release per patient's request.  We will see her back 2 weeks postop.  Questions encouraged and answered at length.  Scar tissue mobilization encouraged over friend's incision sites.

## 2020-08-16 ENCOUNTER — Other Ambulatory Visit: Payer: Self-pay | Admitting: Orthopaedic Surgery

## 2020-08-16 DIAGNOSIS — G5602 Carpal tunnel syndrome, left upper limb: Secondary | ICD-10-CM | POA: Diagnosis not present

## 2020-08-16 MED ORDER — HYDROCODONE-ACETAMINOPHEN 5-325 MG PO TABS
1.0000 | ORAL_TABLET | Freq: Four times a day (QID) | ORAL | 0 refills | Status: AC | PRN
Start: 2020-08-16 — End: ?

## 2020-08-29 ENCOUNTER — Encounter: Payer: Self-pay | Admitting: Orthopaedic Surgery

## 2020-08-29 ENCOUNTER — Ambulatory Visit (INDEPENDENT_AMBULATORY_CARE_PROVIDER_SITE_OTHER): Payer: BC Managed Care – PPO | Admitting: Orthopaedic Surgery

## 2020-08-29 DIAGNOSIS — G5601 Carpal tunnel syndrome, right upper limb: Secondary | ICD-10-CM

## 2020-08-29 DIAGNOSIS — M65331 Trigger finger, right middle finger: Secondary | ICD-10-CM

## 2020-08-29 NOTE — Progress Notes (Signed)
HPI: [name] returns today status post left carpal tunnel release 08/16/2020.  She states she feels like she keeps ripping the incision open.  She is also having triggering of her right middle finger and it is painful.  States it will get stuck with the simplest activity.  She has had no treatment for this.  States that it triggers mostly during the day and not at night.  Physical exam: Left hand surgical incisions well approximated however is not completely healed at this point.  No signs of wound dehiscence.  No drainage.  Sensation grossly intact throughout the fingers left hand and full motor left hand. Right middle finger no active triggering she has tenderness over the A1 pulley.  Impression: Status post left carpal tunnel release 08/16/2020  Right middle trigger finger   Plan: Offered patient injection right hand for trigger finger she defers.  She will begin applying Voltaren gel 4 times a day to the A1 pulley region.  In regards to her left hand she will continue current care we will have her back next week for removal of sutures.  She is out of work will reevaluate her work status at return in a week.

## 2020-09-03 IMAGING — MR MR THORACIC SPINE WO/W CM
5 of 10 series · 22 of 48 positions shown · IV contrast (gadavist)
Comparison: None.

CLINICAL DATA: Back pain. Low-grade fever. Assess for spinal
infection.

EXAM:
MRI THORACIC WITHOUT AND WITH CONTRAST
TECHNIQUE: Multiplanar and multiecho pulse sequences of the thoracic spine were
obtained without and with intravenous contrast.
CONTRAST:  6mL GADAVIST GADOBUTROL 1 MMOL/ML IV SOLN

[Series 17: T1 · sagittal · 3.0mm · 0.62mm/px · 2 of 9 slices shown (1 of 3)]
[im 1/9]
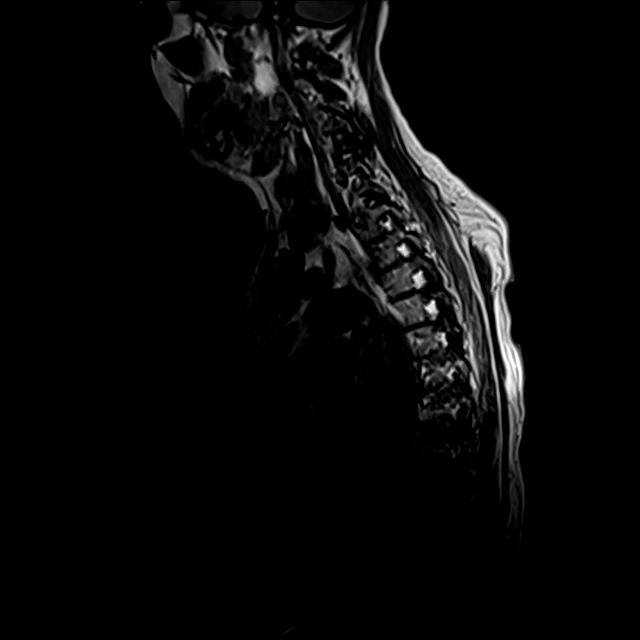
[im 9/9]
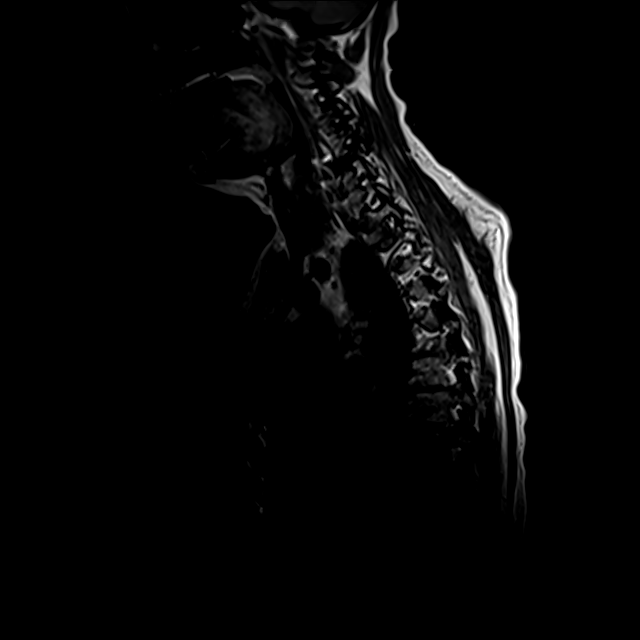

[Series 19: T2 · sagittal · 3.0mm · 0.67mm/px · 3 of 17 slices shown (1 of 2)]
[im 1/17]
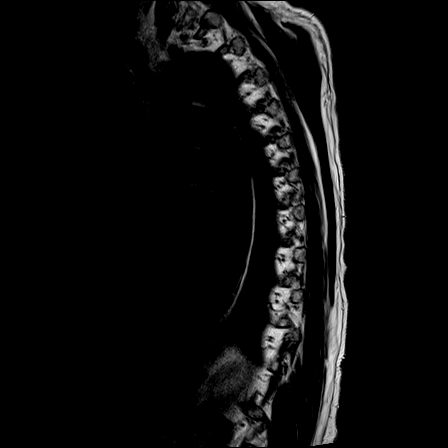
[im 9/17]
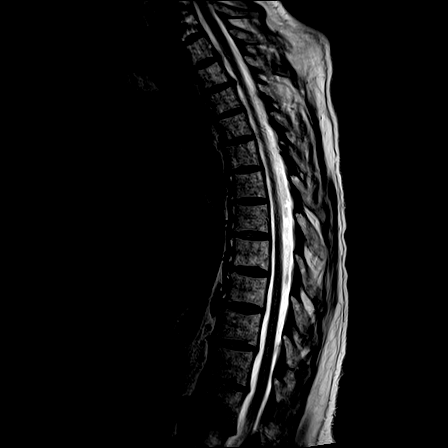
[im 17/17]
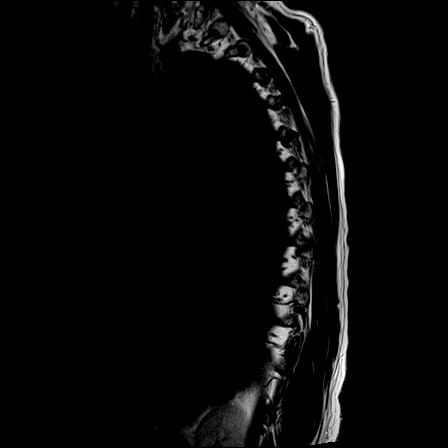

[Series 20: T1 · sagittal · 3.0mm · 0.67mm/px · 3 of 17 slices shown (2 of 3)]
[im 1/17]
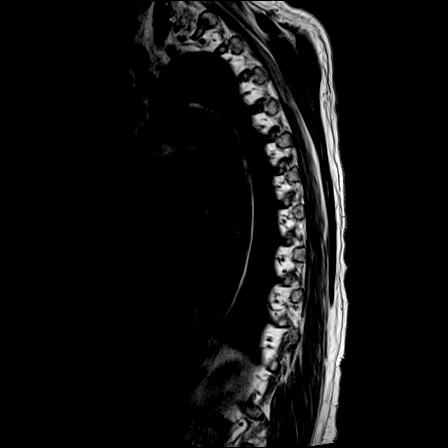
[im 9/17]
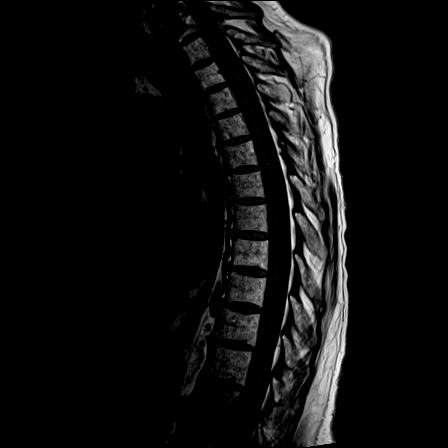
[im 17/17]
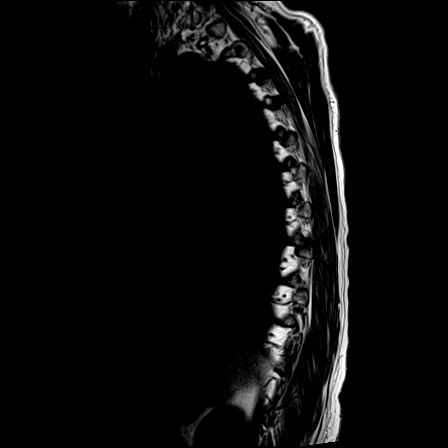

[Series 22: T2 · axial · 5.0mm · 0.59mm/px · z∈[-188,+15]mm · 8 of 39 slices shown (2 of 2)]
[im 1/39]
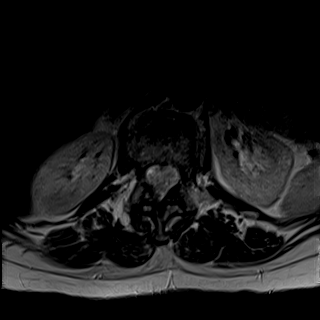
[im 6/39]
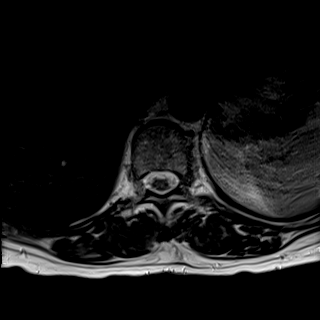
[im 11/39]
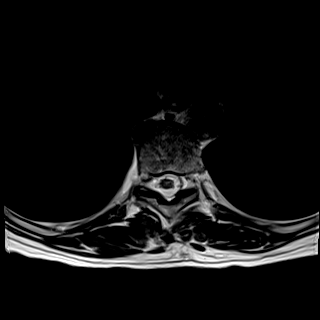
[im 17/39]
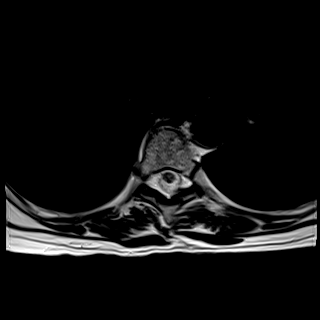
[im 22/39]
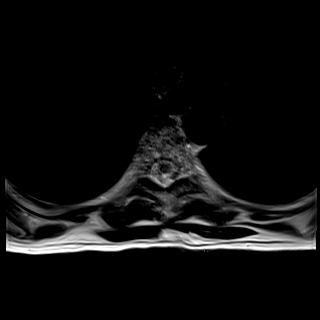
[im 28/39]
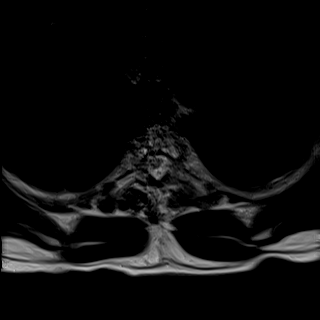
[im 33/39]
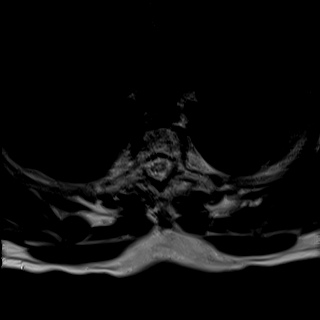
[im 39/39]
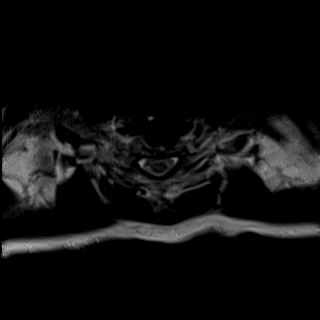

[Series 24: T1 · axial · non-contrast · 5.0mm · 0.31mm/px · z∈[-188,-22]mm · 6 of 39 slices shown (3 of 3)]
[im 1/39]
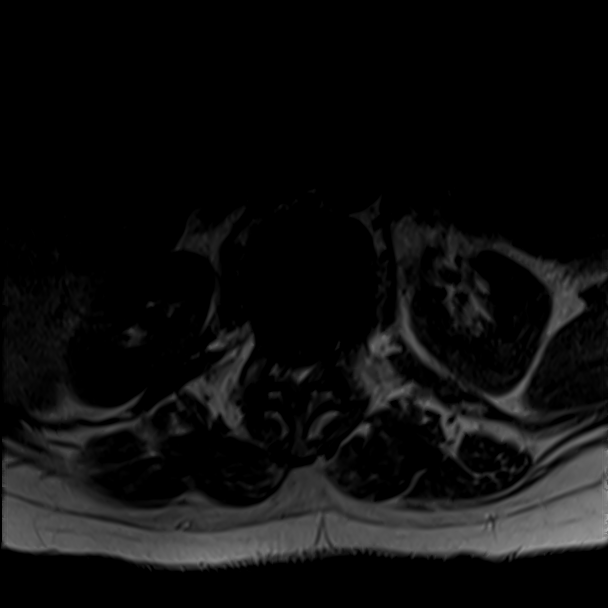
[im 6/39]
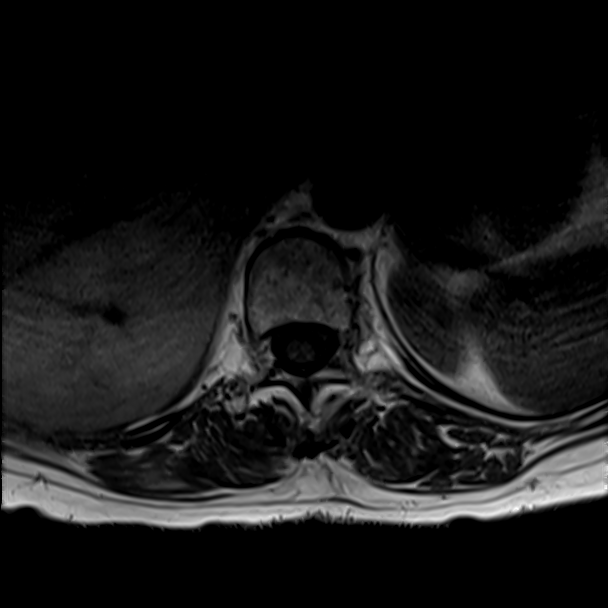
[im 11/39]
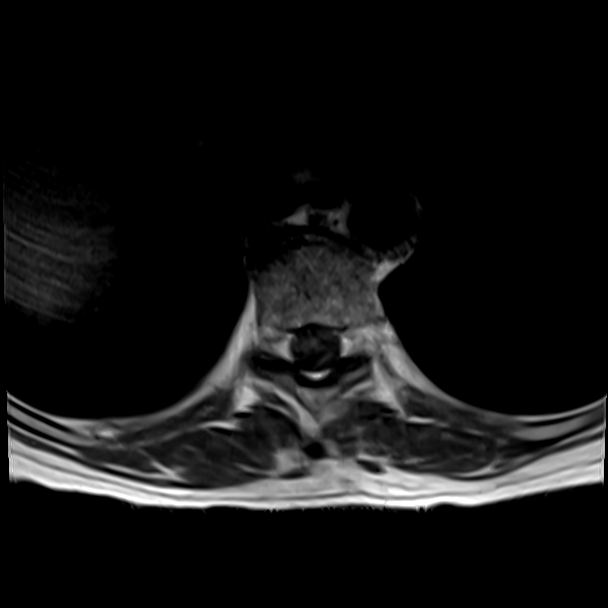
[im 17/39]
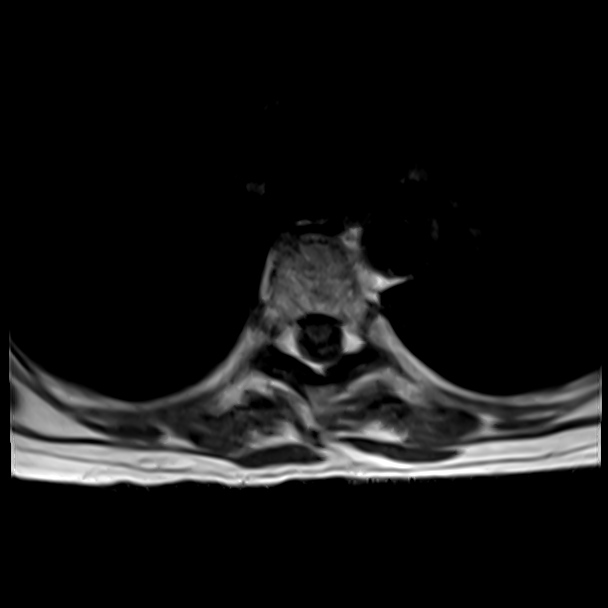
[im 22/39]
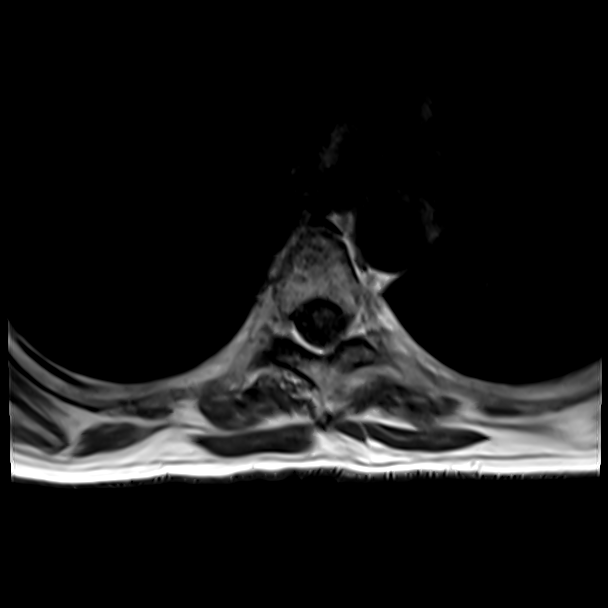
[im 28/39]
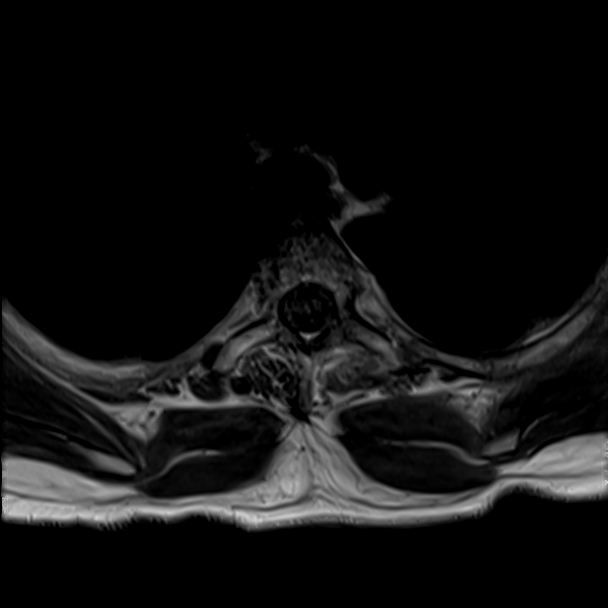

[22 of 48 positions shown; findings below may reference images not displayed]

FINDINGS: MRI THORACIC SPINE FINDINGS

Alignment:  Normal

Vertebrae: Abnormal marrow signal at the T12-L1 level. See below.
All other vertebral bodies appear normal.

Cord:  No cord compression or primary cord lesion.

Paraspinal and other soft tissues: Negative

Disc levels:

T1-2: Right posterolateral disc herniation. No cord compression.
Mild proximal foraminal narrowing.

T2-3: Central disc herniation. Narrowing of the subarachnoid space
but no cord compression. No foraminal compromise.

T3-4 through T9-10: Unremarkable. No significant disc pathology.
Ordinary facet osteoarthritis.

T10-11: Minimal disc bulge.  No stenosis.

T11-12: Minimal disc bulge.  No stenosis.

T12-L1: Probable chronic disc degeneration with loss of disc height.
Primarily sclerotic change within the T12 and L1 vertebral bodies.
Minimal edema and enhancement, less than would be expected in
discitis osteomyelitis. This is quite likely degenerative in nature.
IMPRESSION: No sign of spinal infection in the thoracic region.

Right paracentral disc herniation at T1-2 could be painful. No gross
neural compression however.

Central disc herniation at T2-3 could be painful. No visible neural
compression.

Chronic degenerative spondylosis at T12-L1 could be a source of
chronic back pain in that region. No finding to suggest active
discitis osteomyelitis per

## 2020-09-05 ENCOUNTER — Ambulatory Visit (INDEPENDENT_AMBULATORY_CARE_PROVIDER_SITE_OTHER): Payer: BC Managed Care – PPO | Admitting: Orthopaedic Surgery

## 2020-09-05 ENCOUNTER — Encounter: Payer: Self-pay | Admitting: Orthopaedic Surgery

## 2020-09-05 DIAGNOSIS — Z9889 Other specified postprocedural states: Secondary | ICD-10-CM

## 2020-09-05 NOTE — Progress Notes (Signed)
Patient is 3 weeks now status post a carpal tunnel release on the left hand.  She is doing well overall.  She has had this done before on the right hand.  The right hand was done in early March.  We cut the sutures in 1 more week on the left hand just to let things harden up.  He is doing well overall.  The incision looks good on the left palm.  I remove the sutures and placed Steri-Strips.  She will continue to use her hand as appropriate.  We do need to keep her out of work for the next 4 weeks as she rehabilitates her hands to allow her to return to full work duties.  I will reevaluate in 4 weeks notably at that point can get her back to work.  She will use her hands as comfort allows.

## 2020-09-14 ENCOUNTER — Telehealth: Payer: Self-pay | Admitting: Orthopaedic Surgery

## 2020-09-14 NOTE — Telephone Encounter (Signed)
Pt states disability papers say she go backs to work 09/25/20 but she don't come back to see blackman until the 18th. Wondering can you fax them something to extend the date?

## 2020-09-17 NOTE — Telephone Encounter (Signed)
09/05/20 OOW note faxed to [facility]. IC lmvm advised pt

## 2020-10-03 ENCOUNTER — Ambulatory Visit (INDEPENDENT_AMBULATORY_CARE_PROVIDER_SITE_OTHER): Payer: BC Managed Care – PPO | Admitting: Orthopaedic Surgery

## 2020-10-03 ENCOUNTER — Encounter: Payer: Self-pay | Admitting: Orthopaedic Surgery

## 2020-10-03 DIAGNOSIS — Z9889 Other specified postprocedural states: Secondary | ICD-10-CM

## 2020-10-03 NOTE — Progress Notes (Signed)
The patient comes in having had both carpal tunnels released earlier this year in February in late March.  She is doing well overall.  She says is still tender to the touch but overall she is doing well.  She wants me to allow her to return to work starting tomorrow.  There is no atrophy of the muscles in her hands.  She has good grip and pinch strength.  She is tender over the left incision and that was the more recent surgery.  Both incisions of healed nicely.  Her fingers are all well perfused.  At this point follow-up can be as needed.  She understands that the nerves will eventually continue to improve itself over time.  It is good that she is doing well.  All questions and concerns were answered and addressed.  Follow-up is as needed.

## 2021-10-11 IMAGING — CR DG WRIST COMPLETE 3+V*R*
4 series · 4 of 4 positions shown · non-contrast
Comparison: None.

CLINICAL DATA: Fall.  Right wrist pain.

EXAM:
RIGHT WRIST - COMPLETE 3+ VIEW

[wrist pa]
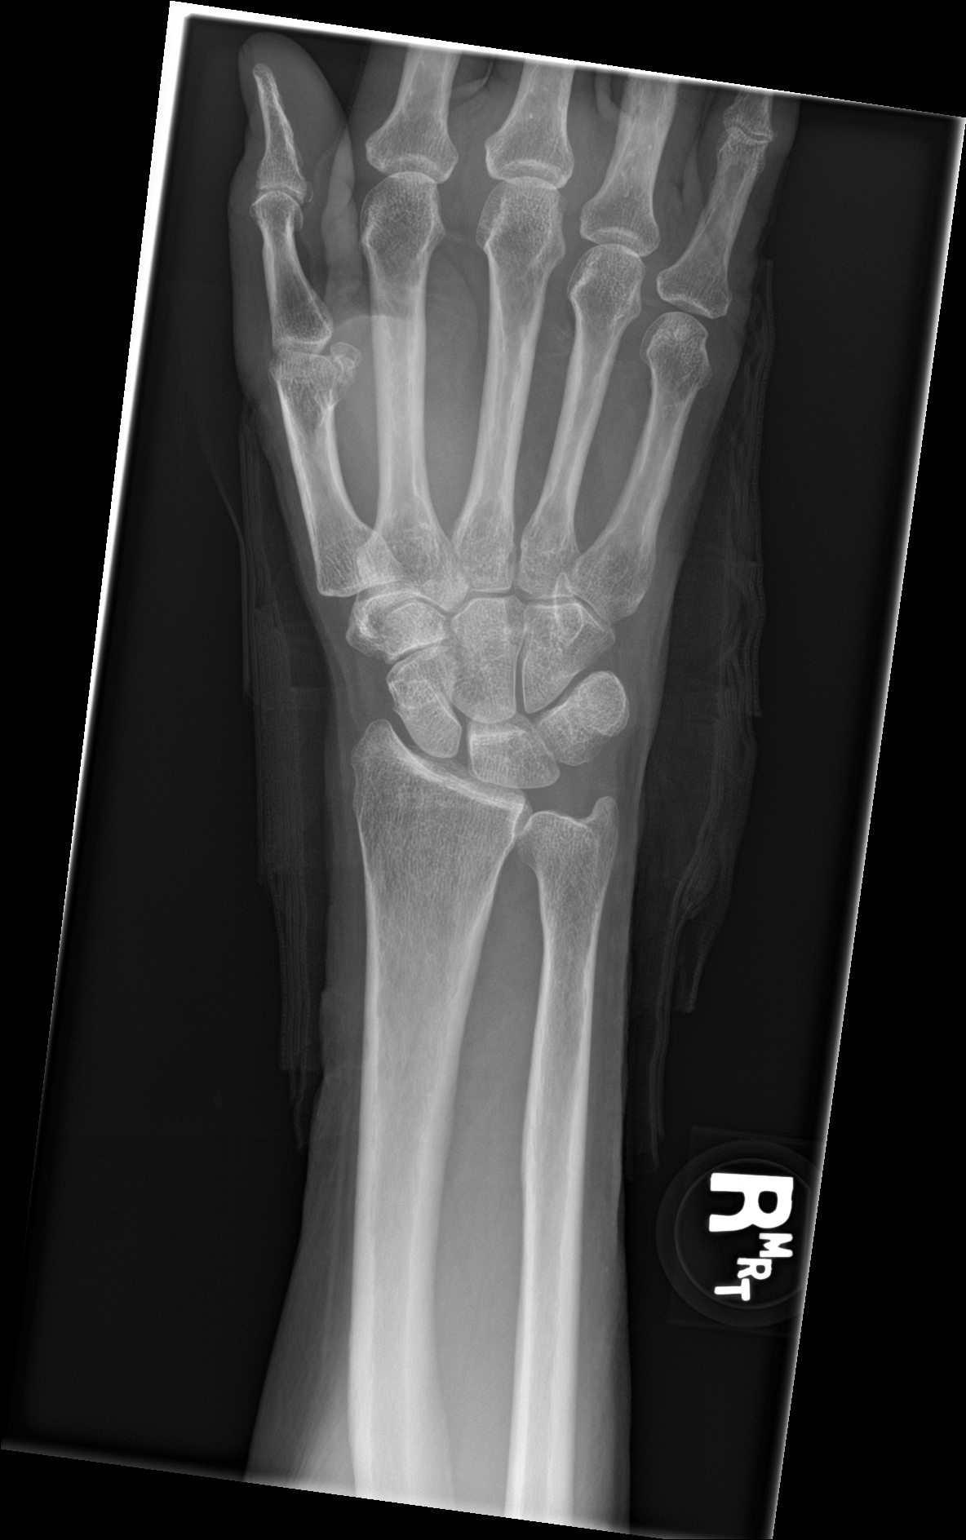

[wrist obl]
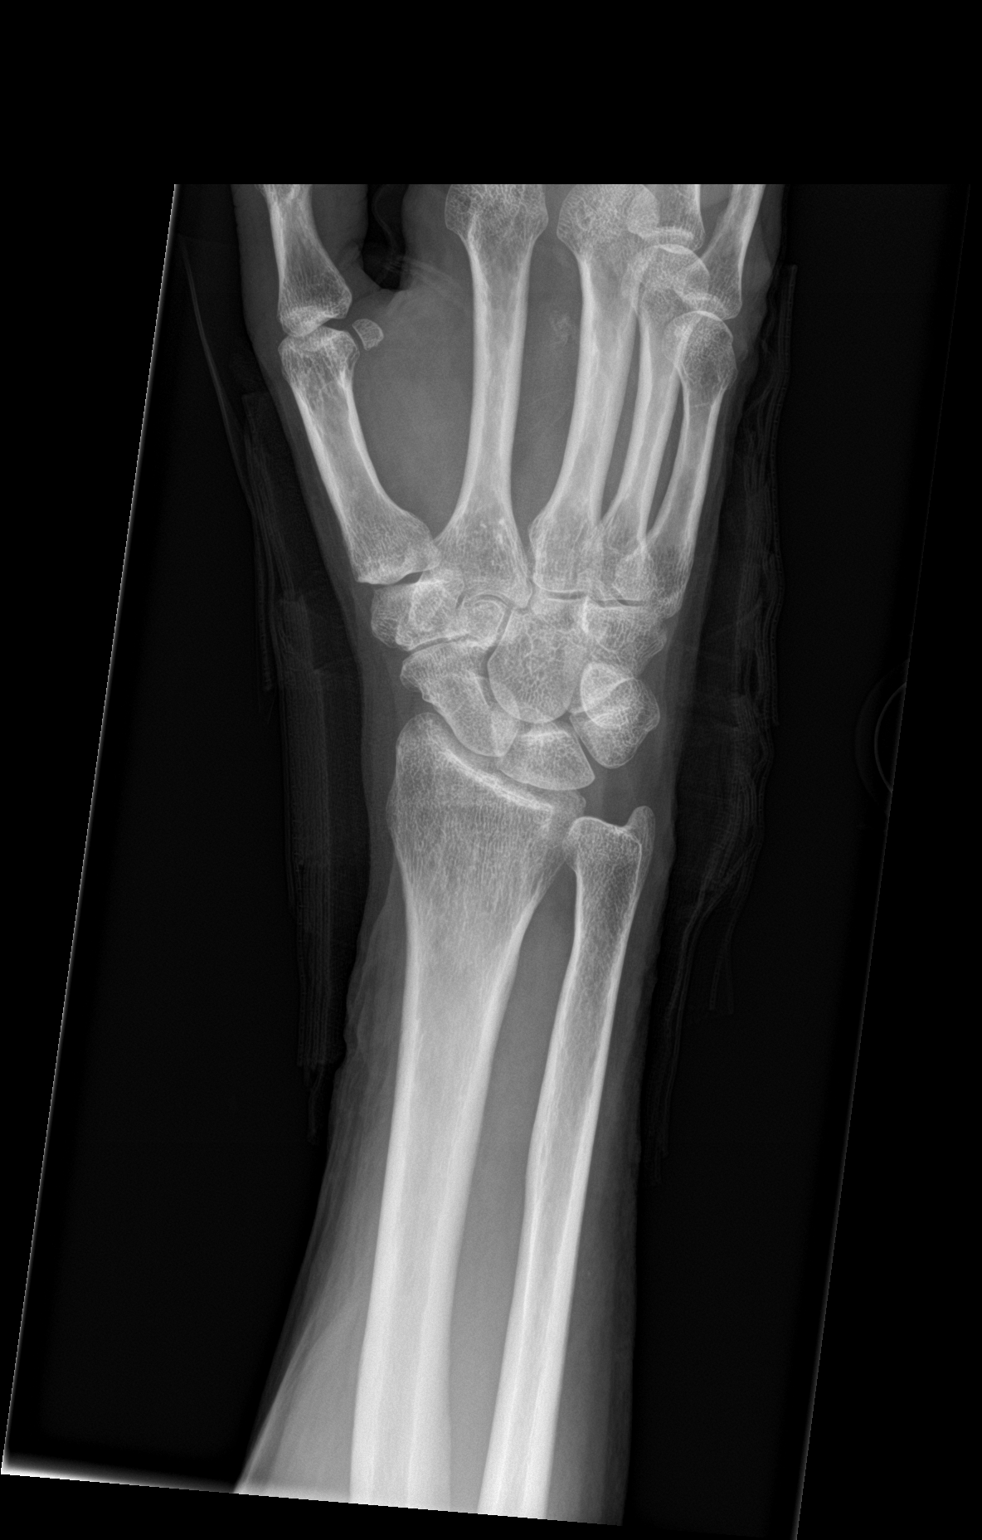

[wrist lat]
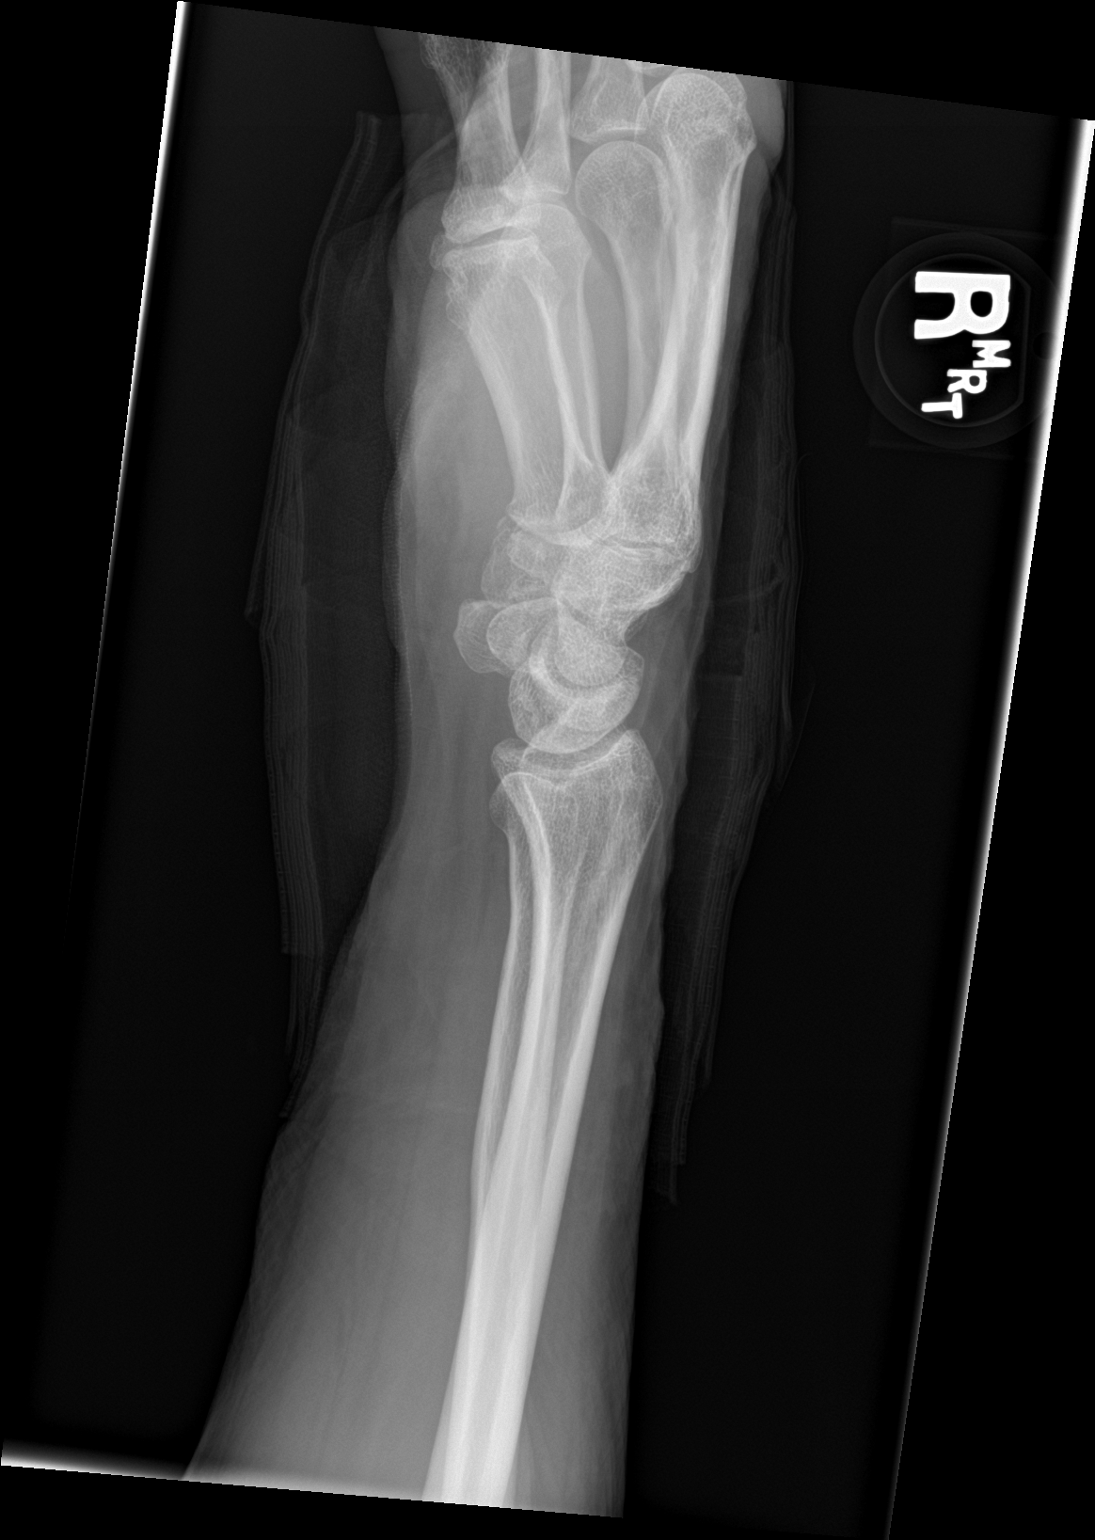

[wrist navicular]
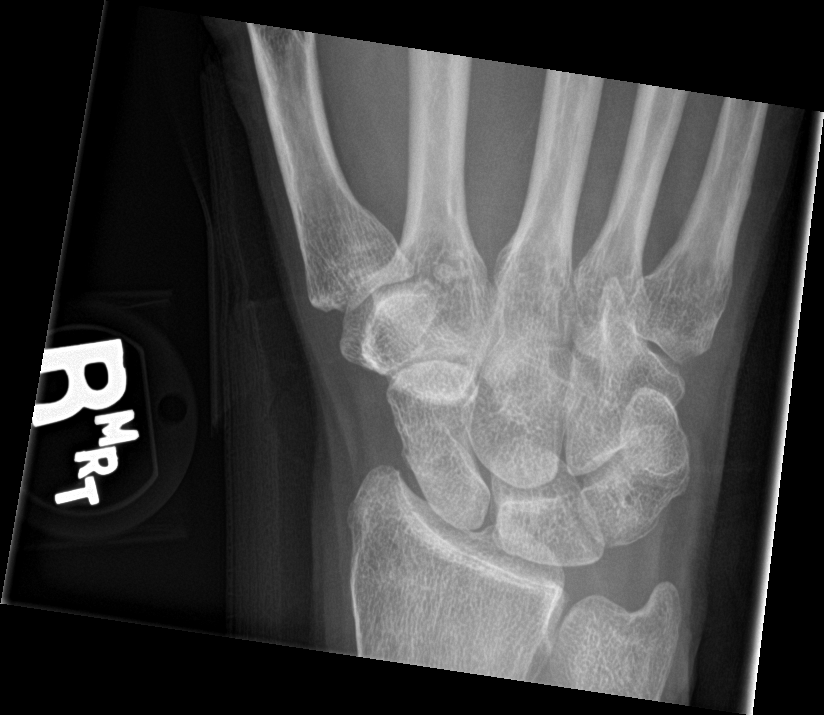

[4 of 4 positions shown; findings below may reference images not displayed]

FINDINGS: There is no evidence of fracture or dislocation. Degenerative
changes are noted in the radial carpus. Soft tissues are
unremarkable.
IMPRESSION: Negative.
